# Patient Record
Sex: Female | Born: 1950 | Race: White | Hispanic: No | Marital: Married | Smoking: Former smoker
Health system: Southern US, Community
[De-identification: ages and names within clinical notes are randomized; demographics above are authoritative.]

## PROBLEM LIST (undated history)

## (undated) DIAGNOSIS — I10 Essential (primary) hypertension: Secondary | ICD-10-CM

## (undated) DIAGNOSIS — H269 Unspecified cataract: Secondary | ICD-10-CM

## (undated) DIAGNOSIS — E119 Type 2 diabetes mellitus without complications: Secondary | ICD-10-CM

## (undated) HISTORY — DX: Essential (primary) hypertension: I10

## (undated) HISTORY — PX: EYE SURGERY: SHX253

## (undated) HISTORY — DX: Type 2 diabetes mellitus without complications: E11.9

## (undated) HISTORY — DX: Unspecified cataract: H26.9

---

## 2018-08-08 LAB — HM HEPATITIS C SCREENING LAB: HM Hepatitis Screen: NEGATIVE

## 2018-10-11 DIAGNOSIS — R4189 Other symptoms and signs involving cognitive functions and awareness: Secondary | ICD-10-CM | POA: Insufficient documentation

## 2020-07-28 LAB — HM DIABETES EYE EXAM

## 2020-10-28 ENCOUNTER — Ambulatory Visit (INDEPENDENT_AMBULATORY_CARE_PROVIDER_SITE_OTHER): Payer: Medicare Other | Admitting: Family Medicine

## 2020-10-28 ENCOUNTER — Encounter: Payer: Self-pay | Admitting: Family Medicine

## 2020-10-28 ENCOUNTER — Other Ambulatory Visit: Payer: Self-pay

## 2020-10-28 VITALS — BP 106/68 | HR 63 | Temp 97.0°F | Ht 61.0 in | Wt 125.4 lb

## 2020-10-28 DIAGNOSIS — R4701 Aphasia: Secondary | ICD-10-CM | POA: Diagnosis not present

## 2020-10-28 DIAGNOSIS — I10 Essential (primary) hypertension: Secondary | ICD-10-CM

## 2020-10-28 DIAGNOSIS — E119 Type 2 diabetes mellitus without complications: Secondary | ICD-10-CM | POA: Diagnosis not present

## 2020-10-28 DIAGNOSIS — E1165 Type 2 diabetes mellitus with hyperglycemia: Secondary | ICD-10-CM | POA: Insufficient documentation

## 2020-10-28 DIAGNOSIS — I152 Hypertension secondary to endocrine disorders: Secondary | ICD-10-CM | POA: Insufficient documentation

## 2020-10-28 LAB — BAYER DCA HB A1C WAIVED: HB A1C (BAYER DCA - WAIVED): 6.3 % (ref <7.0)

## 2020-10-28 NOTE — Progress Notes (Signed)
New Patient Office Visit  Assessment & Plan:  1. Controlled type 2 diabetes mellitus without complication, without long-term current use of insulin (HCC) Lab Results  Component Value Date   HGBA1C 6.3 10/28/2020    - Diabetes is at goal of A1c < 7. - Medications: continue current medications - Home glucose monitoring: Continue monitoring - Patient is currently taking a statin. Patient is taking an ACE-inhibitor/ARB.   Diabetes Health Maintenance Due  Topic Date Due  . FOOT EXAM  Never done  . OPHTHALMOLOGY EXAM  Never done  . HEMOGLOBIN A1C  04/27/2021   - CBC with Differential/Platelet - CMP14+EGFR - Lipid panel - Microalbumin / creatinine urine ratio - Bayer DCA Hb A1c Waived  2. Essential hypertension - Well controlled on current regimen.  - CBC with Differential/Platelet - CMP14+EGFR - Lipid panel  3. Nonverbal - Patient's husband takes excellent care of her and communicates for her.   Follow-up: Return as directed after labs result.   Hendricks Limes, MSN, APRN, FNP-C [facility]  Subjective:  Patient ID: Barbara Schroeder, female    DOB: 22-Sep-1950  Age: 70 y.o. MRN: [id number]  Patient Care Team: Loman Brooklyn, FNP as PCP - General (Family Medicine)  CC:  Chief Complaint  Patient presents with  . New Patient (Initial Visit)    [facility]   . Establish Care    HPI Oluwatosin Bracy presents to establish care. She is transferring care from [facility].  Patient is accompanied by her husband who is her caregiver and provides all information.  Diabetes: Patient presents for follow up of diabetes. Current symptoms include: none. Known diabetic complications: none. Medication compliance: yes. Current diet: in general, a "healthy" diet  . Current exercise: none. Home blood sugar records: 100s. Is she  on ACE inhibitor or angiotensin II receptor blocker? Yes. Is she on a statin? Yes.    Review of Systems  Constitutional:  Negative for chills, fever, malaise/fatigue and weight loss.  HENT: Negative for congestion, ear discharge, ear pain, nosebleeds, sinus pain, sore throat and tinnitus.   Eyes: Negative for blurred vision, double vision, pain, discharge and redness.  Respiratory: Negative for cough, shortness of breath and wheezing.   Cardiovascular: Negative for chest pain, palpitations and leg swelling.  Gastrointestinal: Negative for abdominal pain, constipation, diarrhea, heartburn, nausea and vomiting.  Genitourinary: Negative for dysuria, frequency and urgency.  Musculoskeletal: Negative for myalgias.  Skin: Negative for rash.  Neurological: Negative for dizziness, seizures, weakness and headaches.  Psychiatric/Behavioral: Negative for depression, substance abuse and suicidal ideas. The patient is not nervous/anxious.     Current Outpatient Medications:  .  atenolol (TENORMIN) 50 MG tablet, Take 50 mg by mouth daily., Disp: , Rfl:  .  atorvastatin (LIPITOR) 20 MG tablet, Take 20 mg by mouth daily., Disp: , Rfl:  .  glipiZIDE (GLUCOTROL) 10 MG tablet, Take 10 mg by mouth 2 (two) times daily., Disp: , Rfl:  .  JARDIANCE 10 MG TABS tablet, Take 10 mg by mouth daily., Disp: , Rfl:  .  lisinopril-hydrochlorothiazide (ZESTORETIC) 20-12.5 MG tablet, Take 1 tablet by mouth daily., Disp: , Rfl:  .  metFORMIN (GLUCOPHAGE-XR) 500 MG 24 hr tablet, Take 1,000 mg by mouth 2 (two) times daily., Disp: , Rfl:   No Known Allergies  Past Medical History:  Diagnosis Date  . Cataract   . Diabetes mellitus without complication ([place])   . Hypertension     Past Surgical History:  Procedure Laterality Date  .  EYE SURGERY      Family History  Problem Relation Age of Onset  . Diabetes Mother   . Throat cancer Brother     Social History   Socioeconomic History  . Marital status: Married    Spouse name: Not on file  . Number of children: Not on file  . Years of education: Not on file  . Highest education  level: Not on file  Occupational History  . Not on file  Tobacco Use  . Smoking status: Former Smoker    Types: Cigarettes    Quit date: 1995    Years since quitting: 27.1  . Smokeless tobacco: Never Used  Vaping Use  . Vaping Use: Never used  Substance and Sexual Activity  . Alcohol use: Not Currently  . Drug use: Never  . Sexual activity: Not on file  Other Topics Concern  . Not on file  Social History Narrative  . Not on file   Social Determinants of Health   Financial Resource Strain: Not on file  Food Insecurity: Not on file  Transportation Needs: Not on file  Physical Activity: Not on file  Stress: Not on file  Social Connections: Not on file  Intimate Partner Violence: Not on file    Objective:   Today's Vitals: BP 106/68   Pulse 63   Temp (!) 97 F (36.1 C) (Temporal)   Ht 5' 1"  (1.549 m)   Wt 125 lb 6.4 oz (56.9 kg)   SpO2 99%   BMI 23.69 kg/m   Physical Exam Vitals reviewed.  Constitutional:      General: She is not in acute distress.    Appearance: Normal appearance. She is normal weight. She is not ill-appearing, toxic-appearing or diaphoretic.  HENT:     Head: Normocephalic and atraumatic.  Eyes:     General: No scleral icterus.       Right eye: No discharge.        Left eye: No discharge.     Conjunctiva/sclera: Conjunctivae normal.  Cardiovascular:     Rate and Rhythm: Normal rate and regular rhythm.     Heart sounds: Normal heart sounds. No murmur heard. No friction rub. No gallop.   Pulmonary:     Effort: Pulmonary effort is normal. No respiratory distress.     Breath sounds: Normal breath sounds. No stridor. No wheezing, rhonchi or rales.  Musculoskeletal:        General: Normal range of motion.     Cervical back: Normal range of motion.  Skin:    General: Skin is warm and dry.     Capillary Refill: Capillary refill takes less than 2 seconds.  Neurological:     General: No focal deficit present.     Mental Status: She is alert  and oriented to person, place, and time. Mental status is at baseline.  Psychiatric:        Attention and Perception: Attention normal.        Mood and Affect: Mood normal.        Speech: She is noncommunicative.        Behavior: Behavior normal.

## 2020-10-29 ENCOUNTER — Encounter: Payer: Self-pay | Admitting: Family Medicine

## 2020-10-29 LAB — MICROALBUMIN / CREATININE URINE RATIO
Creatinine, Urine: 21.6 mg/dL
Microalb/Creat Ratio: 20 mg/g creat (ref 0–29)
Microalbumin, Urine: 4.3 ug/mL

## 2020-10-29 LAB — CBC WITH DIFFERENTIAL/PLATELET
Basophils Absolute: 0 10*3/uL (ref 0.0–0.2)
Basos: 1 %
EOS (ABSOLUTE): 0.6 10*3/uL — ABNORMAL HIGH (ref 0.0–0.4)
Eos: 8 %
Hematocrit: 39.3 % (ref 34.0–46.6)
Hemoglobin: 13.1 g/dL (ref 11.1–15.9)
Immature Grans (Abs): 0 10*3/uL (ref 0.0–0.1)
Immature Granulocytes: 0 %
Lymphocytes Absolute: 1.5 10*3/uL (ref 0.7–3.1)
Lymphs: 21 %
MCH: 29.3 pg (ref 26.6–33.0)
MCHC: 33.3 g/dL (ref 31.5–35.7)
MCV: 88 fL (ref 79–97)
Monocytes Absolute: 0.5 10*3/uL (ref 0.1–0.9)
Monocytes: 7 %
Neutrophils Absolute: 4.4 10*3/uL (ref 1.4–7.0)
Neutrophils: 63 %
Platelets: 195 10*3/uL (ref 150–450)
RBC: 4.47 x10E6/uL (ref 3.77–5.28)
RDW: 11.9 % (ref 11.7–15.4)
WBC: 7.1 10*3/uL (ref 3.4–10.8)

## 2020-10-29 LAB — CMP14+EGFR
ALT: 32 IU/L (ref 0–32)
AST: 25 IU/L (ref 0–40)
Albumin/Globulin Ratio: 2.2 (ref 1.2–2.2)
Albumin: 4.4 g/dL (ref 3.8–4.8)
Alkaline Phosphatase: 61 IU/L (ref 44–121)
BUN/Creatinine Ratio: 21 (ref 12–28)
BUN: 15 mg/dL (ref 8–27)
Bilirubin Total: 0.4 mg/dL (ref 0.0–1.2)
CO2: 21 mmol/L (ref 20–29)
Calcium: 9.8 mg/dL (ref 8.7–10.3)
Chloride: 105 mmol/L (ref 96–106)
Creatinine, Ser: 0.71 mg/dL (ref 0.57–1.00)
GFR calc Af Amer: 102 mL/min/{1.73_m2} (ref >59)
GFR calc non Af Amer: 88 mL/min/{1.73_m2} (ref >59)
Globulin, Total: 2 g/dL (ref 1.5–4.5)
Glucose: 170 mg/dL — ABNORMAL HIGH (ref 65–99)
Potassium: 4.3 mmol/L (ref 3.5–5.2)
Sodium: 140 mmol/L (ref 134–144)
Total Protein: 6.4 g/dL (ref 6.0–8.5)

## 2020-10-29 LAB — LIPID PANEL
Chol/HDL Ratio: 2.4 ratio (ref 0.0–4.4)
Cholesterol, Total: 99 mg/dL — ABNORMAL LOW (ref 100–199)
HDL: 41 mg/dL (ref >39)
LDL Chol Calc (NIH): 25 mg/dL (ref 0–99)
Triglycerides: 214 mg/dL — ABNORMAL HIGH (ref 0–149)
VLDL Cholesterol Cal: 33 mg/dL (ref 5–40)

## 2020-10-30 ENCOUNTER — Telehealth: Payer: Self-pay

## 2020-11-03 ENCOUNTER — Encounter: Payer: Self-pay | Admitting: Family Medicine

## 2020-12-07 ENCOUNTER — Other Ambulatory Visit: Payer: Self-pay | Admitting: Family Medicine

## 2020-12-07 NOTE — Telephone Encounter (Signed)
  Prescription Request  12/07/2020  What is the name of the medication or equipment? Lisinopril  Have you contacted your pharmacy to request a refill? (if applicable) no  Which pharmacy would you like this sent to? [facility] in [place]  Patient notified that their request is being sent to the clinical staff for review and that they should receive a response within 2 business days.

## 2020-12-09 MED ORDER — LISINOPRIL-HYDROCHLOROTHIAZIDE 20-12.5 MG PO TABS: 1.0000 | ORAL_TABLET | Freq: Every day | ORAL | 1 refills | Status: DC

## 2020-12-09 NOTE — Telephone Encounter (Signed)
Please advise patient to schedule f/u 6 months from the last.

## 2021-01-06 ENCOUNTER — Telehealth: Payer: Self-pay

## 2021-01-06 DIAGNOSIS — I10 Essential (primary) hypertension: Secondary | ICD-10-CM

## 2021-01-06 NOTE — Telephone Encounter (Signed)
  Prescription Request  01/06/2021  What is the name of the medication or equipment? Atenolol  Have you contacted your pharmacy to request a refill? (if applicable) Yes  Which pharmacy would you like this sent to? [facility], [place] [employer]  Please call pts husband when its sent [phone]

## 2021-01-06 NOTE — Telephone Encounter (Signed)
Medication has not been sent by Alona Bene  Please advise if this medication can be sent in.

## 2021-01-07 MED ORDER — ATENOLOL 50 MG PO TABS: 50.0000 mg | ORAL_TABLET | Freq: Every day | ORAL | 1 refills | Status: DC

## 2021-01-07 NOTE — Telephone Encounter (Signed)
Refilled

## 2021-01-18 ENCOUNTER — Other Ambulatory Visit: Payer: Self-pay | Admitting: Family Medicine

## 2021-01-18 DIAGNOSIS — E119 Type 2 diabetes mellitus without complications: Secondary | ICD-10-CM

## 2021-01-18 MED ORDER — ATORVASTATIN CALCIUM 20 MG PO TABS: 20.0000 mg | ORAL_TABLET | Freq: Every day | ORAL | 1 refills | Status: DC

## 2021-01-18 NOTE — Progress Notes (Signed)
Patient in need of a refill per husband who was here for a visit for himself.

## 2021-02-25 ENCOUNTER — Telehealth: Payer: Self-pay | Admitting: Family Medicine

## 2021-02-25 MED ORDER — METFORMIN HCL ER 500 MG PO TB24: 1000.0000 mg | ORAL_TABLET | Freq: Two times a day (BID) | ORAL | 1 refills | Status: DC

## 2021-02-25 NOTE — Telephone Encounter (Signed)
Left detailed message med sent in

## 2021-02-25 NOTE — Telephone Encounter (Signed)
  Prescription Request  02/25/2021  What is the name of the medication or equipment?metFORMIN (GLUCOPHAGE-XR) 500 MG 24 hr tablet   Have you contacted your pharmacy to request a refill? (if applicable) yes  Which pharmacy would you like this sent to? [facility] in [place]    Patient notified that their request is being sent to the clinical staff for review and that they should receive a response within 2 business days.

## 2021-04-02 ENCOUNTER — Other Ambulatory Visit: Payer: Self-pay | Admitting: Family Medicine

## 2021-04-02 MED ORDER — GLIPIZIDE 10 MG PO TABS: 10.0000 mg | ORAL_TABLET | Freq: Two times a day (BID) | ORAL | 1 refills | Status: DC

## 2021-04-02 NOTE — Telephone Encounter (Signed)
  Prescription Request  04/02/2021  What is the name of the medication or equipment? Glipizide 10 mg. Britney hadn't filled before and needs a RX called in  Have you contacted your pharmacy to request a refill? (if applicable) YES  Which pharmacy would you like this sent to? [facility] in [place], [place]   Patient notified that their request is being sent to the clinical staff for review and that they should receive a response within 2 business days.

## 2021-04-02 NOTE — Telephone Encounter (Signed)
LMOVM refill sent to pharmacy 

## 2021-04-27 ENCOUNTER — Other Ambulatory Visit: Payer: Self-pay

## 2021-04-27 ENCOUNTER — Ambulatory Visit (INDEPENDENT_AMBULATORY_CARE_PROVIDER_SITE_OTHER): Payer: Medicare Other | Admitting: Family Medicine

## 2021-04-27 ENCOUNTER — Ambulatory Visit (INDEPENDENT_AMBULATORY_CARE_PROVIDER_SITE_OTHER): Payer: Medicare Other

## 2021-04-27 ENCOUNTER — Encounter: Payer: Self-pay | Admitting: Family Medicine

## 2021-04-27 VITALS — BP 115/71 | HR 71 | Temp 96.7°F | Ht 61.0 in | Wt 132.0 lb

## 2021-04-27 DIAGNOSIS — Z780 Asymptomatic menopausal state: Secondary | ICD-10-CM

## 2021-04-27 DIAGNOSIS — I10 Essential (primary) hypertension: Secondary | ICD-10-CM | POA: Diagnosis not present

## 2021-04-27 DIAGNOSIS — E1165 Type 2 diabetes mellitus with hyperglycemia: Secondary | ICD-10-CM

## 2021-04-27 LAB — BAYER DCA HB A1C WAIVED: HB A1C (BAYER DCA - WAIVED): 7.5 % — ABNORMAL HIGH (ref <7.0)

## 2021-04-27 MED ORDER — EMPAGLIFLOZIN 25 MG PO TABS: 25.0000 mg | ORAL_TABLET | Freq: Every day | ORAL | 2 refills | Status: DC

## 2021-04-27 NOTE — Patient Instructions (Addendum)
Reminder: please return after mid-September for your flu shot. If you receive this elsewhere, such as your pharmacy, please let us know so we can get this documented in your chart.   

## 2021-04-27 NOTE — Progress Notes (Signed)
Assessment & Plan:  1. Type 2 diabetes mellitus with hyperglycemia, without long-term current use of insulin (HCC) Lab Results  Component Value Date   HGBA1C 6.3 10/28/2020  A1c 7.5 today; increased from 6.3 six months ago. - Diabetes is not at goal of A1c < 7. - Medications:  Increase Jardiance from 10 mg to 25 mg daily - Home glucose monitoring: Continue monitoring - Patient is currently taking a statin. Patient is taking an ACE-inhibitor/ARB.   Diabetes Health Maintenance Due  Topic Date Due   HEMOGLOBIN A1C  04/27/2021   OPHTHALMOLOGY EXAM  07/28/2021   FOOT EXAM  04/27/2022   - CBC with Differential/Platelet - CMP14+EGFR - Lipid panel - Microalbumin / creatinine urine ratio - Bayer DCA Hb A1c Waived - Referred to clinical pharmacist for prescription assistance for Jardiance   2. Essential hypertension - Well controlled on current regimen.  - CBC with Differential/Platelet - CMP14+EGFR - Lipid panel  3. Postmenopausal estrogen deficiency DEXA today   Follow-up: Return in about 3 months (around 07/28/2021) for DM.   Hendricks Limes, MSN, APRN, FNP-C [facility]  Subjective:  Patient ID: Barbara Schroeder, female    DOB: [birth year]  Age: 70 y.o. MRN: [id number]  Patient Care Team: Loman Brooklyn, FNP as PCP - General (Family Medicine)  CC:  Chief Complaint  Patient presents with   Diabetes   Hypertension    6 month follow up of chronic medical conditions     HPI Barbara Schroeder presents for a follow-up of chronic medical conditions.  Patient is accompanied by her husband who is her caregiver and provides all information as patient is nonverbal.  Diabetes: Patient presents for follow up of diabetes. Current symptoms include: hyperglycemia. Known diabetic complications: none. Medication compliance: yes. Current diet:  no sweets; high carbs . Husband thinks she is getting up and eating at night and washing the dishes so he does not  know. Current exercise: none. Home blood sugar records:  170s fasting; 200-300 after breakfast for the past month . Is she  on ACE inhibitor or angiotensin II receptor blocker? Yes. Is she on a statin? Yes.    Review of Systems  Constitutional:  Negative for chills, fever, malaise/fatigue and weight loss.  HENT:  Negative for congestion, ear discharge, ear pain, nosebleeds, sinus pain, sore throat and tinnitus.   Eyes:  Negative for blurred vision, double vision, pain, discharge and redness.  Respiratory:  Negative for cough, shortness of breath and wheezing.   Cardiovascular:  Negative for chest pain, palpitations and leg swelling.  Gastrointestinal:  Negative for abdominal pain, constipation, diarrhea, heartburn, nausea and vomiting.  Genitourinary:  Negative for dysuria, frequency and urgency.  Musculoskeletal:  Negative for myalgias.  Skin:  Negative for rash.  Neurological:  Negative for dizziness, seizures, weakness and headaches.  Psychiatric/Behavioral:  Negative for depression, substance abuse and suicidal ideas. The patient is not nervous/anxious.    Current Outpatient Medications:    atenolol (TENORMIN) 50 MG tablet, Take 1 tablet (50 mg total) by mouth daily., Disp: 90 tablet, Rfl: 1   atorvastatin (LIPITOR) 20 MG tablet, Take 1 tablet (20 mg total) by mouth daily., Disp: 90 tablet, Rfl: 1   glipiZIDE (GLUCOTROL) 10 MG tablet, Take 1 tablet (10 mg total) by mouth 2 (two) times daily., Disp: 180 tablet, Rfl: 1   JARDIANCE 10 MG TABS tablet, Take 10 mg by mouth daily., Disp: , Rfl:    lisinopril-hydrochlorothiazide (ZESTORETIC) 20-12.5 MG tablet, Take 1  tablet by mouth daily., Disp: 90 tablet, Rfl: 1   metFORMIN (GLUCOPHAGE-XR) 500 MG 24 hr tablet, Take 2 tablets (1,000 mg total) by mouth 2 (two) times daily., Disp: 180 tablet, Rfl: 1  No Known Allergies  Past Medical History:  Diagnosis Date   Cataract    Diabetes mellitus without complication ([place])    Hypertension      Past Surgical History:  Procedure Laterality Date   EYE SURGERY      Family History  Problem Relation Age of Onset   Diabetes Mother    Throat cancer Brother     Social History   Socioeconomic History   Marital status: Married    Spouse name: Not on file   Number of children: Not on file   Years of education: Not on file   Highest education level: Not on file  Occupational History   Not on file  Tobacco Use   Smoking status: Former    Types: Cigarettes    Quit date: 1995    Years since quitting: 27.6   Smokeless tobacco: Never  Vaping Use   Vaping Use: Never used  Substance and Sexual Activity   Alcohol use: Not Currently   Drug use: Never   Sexual activity: Not on file  Other Topics Concern   Not on file  Social History Narrative   Not on file   Social Determinants of Health   Financial Resource Strain: Not on file  Food Insecurity: Not on file  Transportation Needs: Not on file  Physical Activity: Not on file  Stress: Not on file  Social Connections: Not on file  Intimate Partner Violence: Not on file    Objective:   Today's Vitals: BP 115/71   Pulse 71   Temp (!) 96.7 F (35.9 C) (Temporal)   Ht 5' 1"  (1.549 m)   Wt 132 lb (59.9 kg)   SpO2 98%   BMI 24.94 kg/m   Physical Exam Vitals reviewed.  Constitutional:      General: She is not in acute distress.    Appearance: Normal appearance. She is normal weight. She is not ill-appearing, toxic-appearing or diaphoretic.  HENT:     Head: Normocephalic and atraumatic.  Eyes:     General: No scleral icterus.       Right eye: No discharge.        Left eye: No discharge.     Conjunctiva/sclera: Conjunctivae normal.  Cardiovascular:     Rate and Rhythm: Normal rate and regular rhythm.     Heart sounds: Normal heart sounds. No murmur heard.   No friction rub. No gallop.  Pulmonary:     Effort: Pulmonary effort is normal. No respiratory distress.     Breath sounds: Normal breath sounds. No  stridor. No wheezing, rhonchi or rales.  Musculoskeletal:        General: Normal range of motion.     Cervical back: Normal range of motion.  Skin:    General: Skin is warm and dry.     Capillary Refill: Capillary refill takes less than 2 seconds.  Neurological:     General: No focal deficit present.     Mental Status: She is alert and oriented to person, place, and time. Mental status is at baseline.  Psychiatric:        Attention and Perception: Attention normal.        Mood and Affect: Mood normal.        Speech: She is noncommunicative.  Behavior: Behavior normal.

## 2021-04-28 ENCOUNTER — Telehealth: Payer: Self-pay

## 2021-04-28 ENCOUNTER — Telehealth: Payer: Self-pay | Admitting: Family Medicine

## 2021-04-28 ENCOUNTER — Encounter: Payer: Self-pay | Admitting: Family Medicine

## 2021-04-28 DIAGNOSIS — M8580 Other specified disorders of bone density and structure, unspecified site: Secondary | ICD-10-CM

## 2021-04-28 HISTORY — DX: Other specified disorders of bone density and structure, unspecified site: M85.80

## 2021-04-28 LAB — CBC WITH DIFFERENTIAL/PLATELET
Basophils Absolute: 0.1 10*3/uL (ref 0.0–0.2)
Basos: 1 %
EOS (ABSOLUTE): 0.5 10*3/uL — ABNORMAL HIGH (ref 0.0–0.4)
Eos: 9 %
Hematocrit: 40.3 % (ref 34.0–46.6)
Hemoglobin: 13.5 g/dL (ref 11.1–15.9)
Immature Grans (Abs): 0 10*3/uL (ref 0.0–0.1)
Immature Granulocytes: 0 %
Lymphocytes Absolute: 1.3 10*3/uL (ref 0.7–3.1)
Lymphs: 24 %
MCH: 29.2 pg (ref 26.6–33.0)
MCHC: 33.5 g/dL (ref 31.5–35.7)
MCV: 87 fL (ref 79–97)
Monocytes Absolute: 0.4 10*3/uL (ref 0.1–0.9)
Monocytes: 7 %
Neutrophils Absolute: 3 10*3/uL (ref 1.4–7.0)
Neutrophils: 59 %
Platelets: 159 10*3/uL (ref 150–450)
RBC: 4.63 x10E6/uL (ref 3.77–5.28)
RDW: 12.7 % (ref 11.7–15.4)
WBC: 5.2 10*3/uL (ref 3.4–10.8)

## 2021-04-28 LAB — CMP14+EGFR
ALT: 31 IU/L (ref 0–32)
AST: 20 IU/L (ref 0–40)
Albumin/Globulin Ratio: 1.9 (ref 1.2–2.2)
Albumin: 4.1 g/dL (ref 3.8–4.8)
Alkaline Phosphatase: 53 IU/L (ref 44–121)
BUN/Creatinine Ratio: 14 (ref 12–28)
BUN: 11 mg/dL (ref 8–27)
Bilirubin Total: 0.6 mg/dL (ref 0.0–1.2)
CO2: 21 mmol/L (ref 20–29)
Calcium: 9.4 mg/dL (ref 8.7–10.3)
Chloride: 106 mmol/L (ref 96–106)
Creatinine, Ser: 0.81 mg/dL (ref 0.57–1.00)
Globulin, Total: 2.2 g/dL (ref 1.5–4.5)
Glucose: 329 mg/dL — ABNORMAL HIGH (ref 65–99)
Potassium: 4.4 mmol/L (ref 3.5–5.2)
Sodium: 142 mmol/L (ref 134–144)
Total Protein: 6.3 g/dL (ref 6.0–8.5)
eGFR: 80 mL/min/{1.73_m2} (ref >59)

## 2021-04-28 LAB — LIPID PANEL
Chol/HDL Ratio: 2.6 ratio (ref 0.0–4.4)
Cholesterol, Total: 84 mg/dL — ABNORMAL LOW (ref 100–199)
HDL: 32 mg/dL — ABNORMAL LOW (ref >39)
LDL Chol Calc (NIH): 21 mg/dL (ref 0–99)
Triglycerides: 198 mg/dL — ABNORMAL HIGH (ref 0–149)
VLDL Cholesterol Cal: 31 mg/dL (ref 5–40)

## 2021-04-28 NOTE — Chronic Care Management (AMB) (Signed)
  Chronic Care Management   Note  04/28/2021 Name: Barbara Schroeder MRN: [id number] DOB: [birth year]  Barbara Schroeder is a 70 y.o. year old female who is a primary care patient of Loman Brooklyn, FNP. I reached out to [employer] by phone today in response to a referral sent by [name] PCP, Loman Brooklyn, FNP      [name] was given information about Chronic Care Management services today including:  CCM service includes personalized support from designated clinical staff supervised by her physician, including individualized plan of care and coordination with other care providers 24/7 contact phone numbers for assistance for urgent and routine care needs. Service will only be billed when office clinical staff spend 20 minutes or more in a month to coordinate care. Only one practitioner may furnish and bill the service in a calendar month. The patient may stop CCM services at any time (effective at the end of the month) by phone call to the office staff. The patient will be responsible for cost sharing (co-pay) of up to 20% of the service fee (after annual deductible is met).  Patient agreed to services and verbal consent obtained.   Follow up plan: Telephone appointment with care management team member scheduled for:05/14/2021  Noreene Larsson, [address], [place], [place] [address] Direct Dial: [phone] [email or web address]

## 2021-04-28 NOTE — Telephone Encounter (Signed)
Refer to lab results.  

## 2021-05-14 ENCOUNTER — Ambulatory Visit (INDEPENDENT_AMBULATORY_CARE_PROVIDER_SITE_OTHER): Payer: Medicare Other | Admitting: Pharmacist

## 2021-05-14 DIAGNOSIS — E1165 Type 2 diabetes mellitus with hyperglycemia: Secondary | ICD-10-CM | POA: Diagnosis not present

## 2021-05-14 MED ORDER — DAPAGLIFLOZIN PROPANEDIOL 10 MG PO TABS: 10.0000 mg | ORAL_TABLET | Freq: Every day | ORAL | 3 refills | Status: DC

## 2021-05-14 NOTE — Progress Notes (Signed)
Chronic Care Management Pharmacy Note  05/14/2021 Name:  Barbara Schroeder MRN:  [id number] DOB:  10/31/1950  Summary: diabetes management   Recommendations/Changes made from today's visit: Diabetes: Uncontrolled-recent increase in a1c TO 7.4%; current treatment:metformin, glipizide, jardiance (samples);  Patient cannot afford jardiance (tolerated samples well) Will transition to farxiga 10MG DAILY for ease of patient assistance (application sent to [place] patient assistance) Current glucose readings: fasting glucose: <160-170, post prandial glucose: <200 Denies hypoglycemic/hyperglycemic symptoms Discussed meal planning options and Plate method for healthy eating Avoid sugary drinks and desserts Incorporate balanced protein, non starchy veggies, 1 serving of carbohydrate with each meal Increase water intake Increase physical activity as able  Current exercise: N/A Educated on [place] patient finances. APPLICATION SUBMITTED FOR FARXIGA ([place])--30 DAY SAMPLES GIVEN TO PATIENT TODAY  Follow Up Plan: Telephone follow up appointment with care management team member scheduled for: 1 MONTH  Subjective: Barbara Schroeder is an 70 y.o. year old female who is a primary patient of Loman Brooklyn, FNP.  The CCM team was consulted for assistance with disease management and care coordination needs.    Engaged with patient by telephone for initial visit in response to provider referral for pharmacy case management and/or care coordination services.   Consent to Services:  The patient was given the following information about Chronic Care Management services today, agreed to services, and gave verbal consent: 1. CCM service includes personalized support from designated clinical staff supervised by the primary care provider, including individualized plan of care and coordination with other care providers 2. 24/7 contact phone numbers for assistance for urgent and  routine care needs. 3. Service will only be billed when office clinical staff spend 20 minutes or more in a month to coordinate care. 4. Only one practitioner may furnish and bill the service in a calendar month. 5.The patient may stop CCM services at any time (effective at the end of the month) by phone call to the office staff. 6. The patient will be responsible for cost sharing (co-pay) of up to 20% of the service fee (after annual deductible is met). Patient agreed to services and consent obtained.  Patient Care Team: Loman Brooklyn, FNP as PCP - General (Family Medicine) Lavera Guise, [facility] as Pharmacist (Family Medicine)   Objective:  Lab Results  Component Value Date   CREATININE 0.81 04/27/2021   CREATININE 0.71 10/28/2020    Lab Results  Component Value Date   HGBA1C 7.5 (H) 04/27/2021   Last diabetic Eye exam:  Lab Results  Component Value Date/Time   HMDIABEYEEXA Retinopathy (A) 07/28/2020 12:00 AM    Last diabetic Foot exam: No results found for: HMDIABFOOTEX      Component Value Date/Time   CHOL 84 (L) 04/27/2021 1339   TRIG 198 (H) 04/27/2021 1339   HDL 32 (L) 04/27/2021 1339   CHOLHDL 2.6 04/27/2021 1339   LDLCALC 21 04/27/2021 1339    Hepatic Function Latest Ref Rng & Units 04/27/2021 10/28/2020  Total Protein 6.0 - 8.5 g/dL 6.3 6.4  Albumin 3.8 - 4.8 g/dL 4.1 4.4  AST 0 - 40 IU/L 20 25  ALT 0 - 32 IU/L 31 32  Alk Phosphatase 44 - 121 IU/L 53 61  Total Bilirubin 0.0 - 1.2 mg/dL 0.6 0.4    No results found for: TSH, FREET4  CBC Latest Ref Rng & Units 04/27/2021 10/28/2020  WBC 3.4 - 10.8 x10E3/uL 5.2 7.1  Hemoglobin 11.1 - 15.9 g/dL 13.5 13.1  Hematocrit 34.0 - 46.6 % 40.3 39.3  Platelets 150 - 450 x10E3/uL 159 195    No results found for: VD25OH  Clinical ASCVD: No  The ASCVD Risk score Mikey Bussing DC Jr., et al., 2013) failed to calculate for the following reasons:   The valid total cholesterol range is 130 to 320 mg/dL    Other: (CHADS2VASc if Afib,  PHQ9 if depression, MMRC or CAT for COPD, ACT, DEXA)  Social History   Tobacco Use  Smoking Status Former   Types: Cigarettes   Quit date: 1995   Years since quitting: 27.6  Smokeless Tobacco Never   BP Readings from Last 3 Encounters:  04/27/21 115/71  10/28/20 106/68   Pulse Readings from Last 3 Encounters:  04/27/21 71  10/28/20 63   Wt Readings from Last 3 Encounters:  04/27/21 132 lb (59.9 kg)  10/28/20 125 lb 6.4 oz (56.9 kg)    Assessment: Review of patient past medical history, allergies, medications, health status, including review of consultants reports, laboratory and other test data, was performed as part of comprehensive evaluation and provision of chronic care management services.   SDOH:  (Social Determinants of Health) assessments and interventions performed:    CCM Care Plan  No Known Allergies  Medications Reviewed Today     Reviewed by Lavera Guise, [facility] (Pharmacist) on 05/14/21 at 80  Med List Status: <None>   Medication Order Taking? Sig Documenting Provider Last Dose Status Informant  atenolol (TENORMIN) 50 MG tablet [id number] No Take 1 tablet (50 mg total) by mouth daily. Loman Brooklyn, FNP Taking Active   atorvastatin (LIPITOR) 20 MG tablet [id number] No Take 1 tablet (20 mg total) by mouth daily. Loman Brooklyn, FNP Taking Active   dapagliflozin propanediol (FARXIGA) 10 MG TABS tablet [id number] Yes Take 1 tablet (10 mg total) by mouth daily before breakfast. Loman Brooklyn, FNP  Active   glipiZIDE (GLUCOTROL) 10 MG tablet [id number] No Take 1 tablet (10 mg total) by mouth 2 (two) times daily. Loman Brooklyn, FNP Taking Active   lisinopril-hydrochlorothiazide (ZESTORETIC) 20-12.5 MG tablet [id number] No Take 1 tablet by mouth daily. Loman Brooklyn, FNP Taking Active   metFORMIN (GLUCOPHAGE-XR) 500 MG 24 hr tablet [id number] No Take 2 tablets (1,000 mg total) by mouth 2 (two) times daily. Loman Brooklyn, FNP Taking Active              Patient Active Problem List   Diagnosis Date Noted   Osteopenia 04/28/2021   Controlled type 2 diabetes mellitus without complication, without long-term current use of insulin ([place]) 10/28/2020   Essential hypertension 10/28/2020   Nonverbal 10/28/2020    Immunization History  Administered Date(s) Administered   Influenza, Seasonal, Injecte, Preservative Fre 08/01/2016   Influenza,inj,Quad PF,6+ Mos 08/08/2018, 05/21/2019   Influenza-Unspecified 07/30/2020   PFIZER(Purple Top)SARS-COV-2 Vaccination 10/25/2019, 11/15/2019, 07/22/2020   Pneumococcal Conjugate-13 02/06/2019   Pneumococcal Polysaccharide-23 01/09/2018   Tdap 12/02/2019    Conditions to be addressed/monitored: DMII  Care Plan : PHARMD MEDICATION MANAGEMENT  Updates made by Lavera Guise, [place] since 05/14/2021 12:00 AM     Problem: DISEASE PROGRESSION PREVENTION      Long-Range Goal: T2DM   This Visit's Progress: Not on track  Priority: High  Note:   Current Barriers:  Unable to independently afford treatment regimen Unable to maintain control of T2DM Suboptimal therapeutic regimen for T2DM Patient is non-verbal; husband communicates on her behalf  Pharmacist Clinical Goal(s):  Over the next 90  days, patient will verbalize ability to afford treatment regimen maintain control of t2dm as evidenced by improved glycemic control  through collaboration with PharmD and provider.    Interventions: 1:1 collaboration with Loman Brooklyn, FNP regarding development and update of comprehensive plan of care as evidenced by provider attestation and co-signature Inter-disciplinary care team collaboration (see longitudinal plan of care) Comprehensive medication review performed; medication list updated in electronic medical record  Diabetes: Uncontrolled-recent increase in a1c TO 7.4%; current treatment:metformin, glipizide, jardiance (samples);  Patient cannot afford jardiance (tolerated samples well) Will  transition to farxiga 10MG DAILY for ease of patient assistance (application sent to [place]&me patient assistance) Current glucose readings: fasting glucose: <160-170, post prandial glucose: <200 Denies hypoglycemic/hyperglycemic symptoms Discussed meal planning options and Plate method for healthy eating Avoid sugary drinks and desserts Incorporate balanced protein, non starchy veggies, 1 serving of carbohydrate with each meal Increase water intake Increase physical activity as able  Current exercise: N/A Educated on [place] patient finances. APPLICATION SUBMITTED FOR FARXIGA ([place])--30 DAY SAMPLES GIVEN TO PATIENT TODAY   Patient Goals/Self-Care Activities Over the next 90 days, patient will:  - take medications as prescribed check glucose DAILY (FASTING), document, and provide at future appointments collaborate with provider on medication access solutions  Follow Up Plan: Telephone follow up appointment with care management team member scheduled for: 1 MONTH      Medication Assistance: Application for farxiga ([place])  medication assistance program. in process.  Anticipated assistance start date tbd.  See plan of care for additional detail.  Patient's preferred pharmacy is:  [facility] [place], [place] [address] [place] [address] Phone: [phone] Fax: [phone]  [facility] [place], [place] [address] Phone: [phone] Fax: [phone]  Uses pill box? No - husband assists Pt /caregiver endorses 100% compliance  Follow Up:  Patient agrees to Care Plan and Follow-up.  Plan: Telephone follow up appointment with care management team member scheduled for:  1 month  Regina Eck, PharmD, BCPS Clinical Pharmacist, [place]  II Phone [phone]

## 2021-05-14 NOTE — Patient Instructions (Signed)
Visit Information  PATIENT GOALS:  Goals Addressed               This Visit's Progress     Patient Stated     T2DM PHARMD GOAL (pt-stated)        Current Barriers:  Unable to independently afford treatment regimen Unable to maintain control of T2DM Suboptimal therapeutic regimen for T2DM Patient is non-verbal; husband communicates on her behalf  Pharmacist Clinical Goal(s):  Over the next 90 days, patient will verbalize ability to afford treatment regimen maintain control of t2dm as evidenced by improved glycemic control  through collaboration with PharmD and provider.    Interventions: 1:1 collaboration with Gwenlyn Fudge, FNP regarding development and update of comprehensive plan of care as evidenced by provider attestation and co-signature Inter-disciplinary care team collaboration (see longitudinal plan of care) Comprehensive medication review performed; medication list updated in electronic medical record  Diabetes: Uncontrolled-recent increase in a1c TO 7.4%; current treatment:metformin, glipizide, jardiance (samples);  Patient cannot afford jardiance (tolerated samples well) Will transition to farxiga 10MG  DAILY for ease of patient assistance (application sent to [place] patient assistance) Current glucose readings: fasting glucose: <160-170, post prandial glucose: <200 Denies hypoglycemic/hyperglycemic symptoms Discussed meal planning options and Plate method for healthy eating Avoid sugary drinks and desserts Incorporate balanced protein, non starchy veggies, 1 serving of carbohydrate with each meal Increase water intake Increase physical activity as able  Current exercise: N/A Educated on TO FARXIGA TRANSITION Assessed patient finances. APPLICATION SUBMITTED FOR FARXIGA ([place])--30 DAY SAMPLES GIVEN TO PATIENT TODAY   Patient Goals/Self-Care Activities Over the next 90 days, patient will:  - take medications as prescribed check glucose DAILY  (FASTING), document, and provide at future appointments collaborate with provider on medication access solutions  Follow Up Plan: Telephone follow up appointment with care management team member scheduled for: 1 MONTH         The patient verbalized understanding of instructions, educational materials, and care plan provided today and declined offer to receive copy of patient instructions, educational materials, and care plan.   Telephone follow up appointment with care management team member scheduled for: 1 month  Signature [employer], PharmD, BCPS Clinical Pharmacist, [facility]  II Phone [phone]

## 2021-05-25 ENCOUNTER — Ambulatory Visit (INDEPENDENT_AMBULATORY_CARE_PROVIDER_SITE_OTHER): Payer: Medicare Other

## 2021-05-25 DIAGNOSIS — Z0000 Encounter for general adult medical examination without abnormal findings: Secondary | ICD-10-CM | POA: Diagnosis not present

## 2021-05-25 NOTE — Progress Notes (Signed)
MEDICARE ANNUAL WELLNESS VISIT  05/25/2021  Telephone Visit Disclaimer This Medicare AWV was conducted by telephone due to national recommendations for restrictions regarding the COVID-19 Pandemic (e.g. social distancing).  I verified, using two identifiers, that I am speaking with Barbara Schroeder or their authorized healthcare agent. I discussed the limitations, risks, security, and privacy concerns of performing an evaluation and management service by telephone and the potential availability of an in-person appointment in the future. The patient expressed understanding and agreed to proceed.  Location of Patient: Home Location of Provider (nurse):  [facility]  Subjective:    Barbara Schroeder is a 70 y.o. female patient of Gwenlyn Fudge, FNP who had a Medicare Annual Wellness Visit today via telephone. Barbara Schroeder is Disabled and lives with their spouse. she has one child. she reports that she is not socially active and does interact with friends/family regularly. she is not physically active and enjoys shopping.  Patient Care Team: Gwenlyn Fudge, FNP as PCP - General (Family Medicine) Danella Maiers, [facility] as Pharmacist (Family Medicine)  Advanced Directives 05/25/2021  Does Patient Have a Medical Advance Directive? No  Would patient like information on creating a medical advance directive? No - Guardian declined    Hospital Utilization Over the Past 12 Months: # of hospitalizations or ER visits: 0 # of surgeries: 0  Review of Systems    Patient reports that her overall health is unchanged compared to last year.  Patient Reported Readings (BP, Pulse, CBG, Weight, etc) CBG- 96  Pain Assessment Pain : No/denies pain     Current Medications & Allergies (verified) Allergies as of 05/25/2021   No Known Allergies      Medication List        Accurate as of May 25, 2021  4:20 PM. If you have any questions, ask your nurse or doctor.           atenolol 50 MG tablet Commonly known as: TENORMIN Take 1 tablet (50 mg total) by mouth daily.   atorvastatin 20 MG tablet Commonly known as: LIPITOR Take 1 tablet (20 mg total) by mouth daily.   dapagliflozin propanediol 10 MG Tabs tablet Commonly known as: Farxiga Take 1 tablet (10 mg total) by mouth daily before breakfast.   glipiZIDE 10 MG tablet Commonly known as: GLUCOTROL Take 1 tablet (10 mg total) by mouth 2 (two) times daily.   lisinopril-hydrochlorothiazide 20-12.5 MG tablet Commonly known as: ZESTORETIC Take 1 tablet by mouth daily.   metFORMIN 500 MG 24 hr tablet Commonly known as: GLUCOPHAGE-XR Take 2 tablets (1,000 mg total) by mouth 2 (two) times daily.        History (reviewed): Past Medical History:  Diagnosis Date   Cataract    Diabetes mellitus without complication (HCC)    Hypertension    Osteopenia 04/28/2021   Past Surgical History:  Procedure Laterality Date   EYE SURGERY     Family History  Problem Relation Age of Onset   Diabetes Mother    Throat cancer Brother    Social History   Socioeconomic History   Marital status: Married    Spouse name: Not on file   Number of children: Not on file   Years of education: Not on file   Highest education level: Not on file  Occupational History   Not on file  Tobacco Use   Smoking status: Former    Types: Cigarettes    Quit date: 1995  Years since quitting: 27.6   Smokeless tobacco: Never  Vaping Use   Vaping Use: Never used  Substance and Sexual Activity   Alcohol use: Not Currently   Drug use: Never   Sexual activity: Not on file  Other Topics Concern   Not on file  Social History Narrative   Not on file   Social Determinants of Health   Financial Resource Strain: Not on file  Food Insecurity: Not on file  Transportation Needs: Not on file  Physical Activity: Not on file  Stress: Not on file  Social Connections: Not on file    Activities of Daily Living In your  present state of health, do you have any difficulty performing the following activities: 05/25/2021  Hearing? Y  Vision? N  Difficulty concentrating or making decisions? Y  Walking or climbing stairs? N  Dressing or bathing? N  Doing errands, shopping? Y  Preparing Food and eating ? Y  Using the Toilet? N  In the past six months, have you accidently leaked urine? N  Do you have problems with loss of bowel control? N  Managing your Medications? Y  Managing your Finances? Y  Housekeeping or managing your Housekeeping? Y    Patient Education/ Literacy How often do you need to have someone help you when you read instructions, pamphlets, or other written materials from your doctor or pharmacy?: 5 - Always  Exercise Current Exercise Habits: The patient does not participate in regular exercise at present, Exercise limited by: psychological condition(s)  Diet Patient reports consuming 3 meals a day and 2 snack(s) a day Patient reports that her primary diet is: Regular Patient reports that she does have regular access to food.   Depression Screen PHQ 2/9 Scores 04/27/2021 10/28/2020  PHQ - 2 Score 0 0  PHQ- 9 Score 4 -     Fall Risk Fall Risk  05/25/2021 04/27/2021 10/28/2020  Falls in the past year? 0 0 0     Objective:  Barbara Schroeder seemed alert and oriented and she participated appropriately during our telephone visit.  Blood Pressure Weight BMI  BP Readings from Last 3 Encounters:  04/27/21 115/71  10/28/20 106/68   Wt Readings from Last 3 Encounters:  04/27/21 132 lb (59.9 kg)  10/28/20 125 lb 6.4 oz (56.9 kg)   BMI Readings from Last 1 Encounters:  04/27/21 24.94 kg/m    *Unable to obtain current vital signs, weight, and BMI due to telephone visit type  Hearing/Vision  B[place] did  seem to have difficulty with hearing/understanding during the telephone conversation Reports that she has not had a formal eye exam by an eye care professional within the past year Reports that  she has not had a formal hearing evaluation within the past year *Unable to fully assess hearing and vision during telephone visit type  Cognitive Function: 6CIT Screen 05/25/2021  What Year? (No Data)   (Normal:0-7, Significant for Dysfunction: >8)  Normal Cognitive Function Screening: No: Unable to complete. Husband assisted during telephone visit. Pt would not communicate. She is special needs. Excused her self during call per husband.   Immunization & Health Maintenance Record Immunization History  Administered Date(s) Administered   Influenza, Seasonal, Injecte, Preservative Fre 08/01/2016   Influenza,inj,Quad PF,6+ Mos 08/08/2018, 05/21/2019   Influenza-Unspecified 07/30/2020   PFIZER(Purple Top)SARS-COV-2 Vaccination 10/25/2019, 11/15/2019, 07/22/2020   Pneumococcal Conjugate-13 02/06/2019   Pneumococcal Polysaccharide-23 01/09/2018   Tdap 12/02/2019    Health Maintenance  Topic Date Due   COVID-19 Vaccine (4 -  Booster for [employer] series) 11/19/2020   INFLUENZA VACCINE  06/13/2021 (Originally 04/19/2021)   Zoster Vaccines- Shingrix (1 of 2) 07/28/2021 (Originally 09/09/2003)   COLONOSCOPY (Pts 45-78yrs Insurance coverage will need to be confirmed)  04/27/2022 (Originally 09/08/1998)   OPHTHALMOLOGY EXAM  07/28/2021   HEMOGLOBIN A1C  10/28/2021   FOOT EXAM  04/27/2022   PNA vac Low Risk Adult (2 of 2 - PPSV23) 01/10/2023   DEXA SCAN  04/27/2024   TETANUS/TDAP  12/01/2029   Hepatitis C Screening  Completed   HPV VACCINES  Aged Out   MAMMOGRAM  Discontinued       Assessment  This is a routine wellness examination for [employer].  Health Maintenance: Due or Overdue Health Maintenance Due  Topic Date Due   COVID-19 Vaccine (4 - Booster for [employer] series) 11/19/2020    Barbara Schroeder does not need a referral for Community Assistance: Care Management:   no Social Work:    no Prescription Assistance:  no Nutrition/Diabetes Education:  no   Plan:   Personalized Goals  Goals Addressed   None    Personalized Health Maintenance & Screening Recommendations   2nd Covid Booster  Lung Cancer Screening Recommended: no (Low Dose CT Chest recommended if Age 82-80 years, 30 pack-year currently smoking OR have quit w/in past 15 years) Hepatitis C Screening recommended: no HIV Screening recommended: no  Advanced Directives: Written information was not prepared per patient's request.  Referrals & Orders No orders of the defined types were placed in this encounter.   Follow-up Plan Follow-up with Gwenlyn Fudge, FNP as planned Schedule 07/28/2021   I have personally reviewed and noted the following in the patient's chart:   Medical and social history Use of alcohol, tobacco or illicit drugs  Current medications and supplements Functional ability and status Nutritional status Physical activity Advanced directives List of other physicians Hospitalizations, surgeries, and ER visits in previous 12 months Vitals Screenings to include cognitive, depression, and falls Referrals and appointments  In addition, I have reviewed and discussed with Barbara Schroeder certain preventive protocols, quality metrics, and best practice recommendations. A written personalized care plan for preventive services as well as general preventive health recommendations is available and can be mailed to the patient at her request.      Dorene Sorrow, LPN  05/22/2354

## 2021-05-25 NOTE — Patient Instructions (Signed)
  MEDICARE ANNUAL WELLNESS VISIT Health Maintenance Summary and Written Plan of Care  Barbara Schroeder ,  Thank you for allowing me to perform your Medicare Annual Wellness Visit and for your ongoing commitment to your health.   Health Maintenance & Immunization History Health Maintenance  Topic Date Due   COVID-19 Vaccine (4 - Booster for Pfizer series) 11/19/2020   INFLUENZA VACCINE  06/13/2021 (Originally 04/19/2021)   Zoster Vaccines- Shingrix (1 of 2) 07/28/2021 (Originally 09/09/2003)   COLONOSCOPY (Pts 45-74yrs Insurance coverage will need to be confirmed)  04/27/2022 (Originally 09/08/1998)   OPHTHALMOLOGY EXAM  07/28/2021   HEMOGLOBIN A1C  10/28/2021   FOOT EXAM  04/27/2022   PNA vac Low Risk Adult (2 of 2 - PPSV23) 01/10/2023   DEXA SCAN  04/27/2024   TETANUS/TDAP  12/01/2029   Hepatitis C Screening  Completed   HPV VACCINES  Aged Out   MAMMOGRAM  Discontinued   Immunization History  Administered Date(s) Administered   Influenza, Seasonal, Injecte, Preservative Fre 08/01/2016   Influenza,inj,Quad PF,6+ Mos 08/08/2018, 05/21/2019   Influenza-Unspecified 07/30/2020   PFIZER(Purple Top)SARS-COV-2 Vaccination 10/25/2019, 11/15/2019, 07/22/2020   Pneumococcal Conjugate-13 02/06/2019   Pneumococcal Polysaccharide-23 01/09/2018   Tdap 12/02/2019    These are the patient goals that we discussed:  Goals Addressed   None      This is a list of Health Maintenance Items that are overdue or due now: Health Maintenance Due  Topic Date Due   COVID-19 Vaccine (4 - Booster for Pfizer series) 11/19/2020     Orders/Referrals Placed Today: No orders of the defined types were placed in this encounter.  (Contact our referral department at [phone] if you have not spoken with someone about your referral appointment within the next 5 days)    Follow-up Plan  Scheduled with [name], FNP 07/28/21 at 1:35 pm.

## 2021-05-27 NOTE — Progress Notes (Signed)
I have reviewed and agree with the above AWV documentation.   [name], MSN, APRN, FNP-C [facility] 05/27/21

## 2021-06-02 NOTE — Progress Notes (Signed)
Received notification from [place] regarding approval for [facility] 10MG . Patient assistance approved from 05/28/21 to 09/18/21.  Phone: [phone]

## 2021-06-14 ENCOUNTER — Other Ambulatory Visit: Payer: Self-pay | Admitting: Family Medicine

## 2021-06-15 ENCOUNTER — Ambulatory Visit (INDEPENDENT_AMBULATORY_CARE_PROVIDER_SITE_OTHER): Payer: Medicare Other | Admitting: Pharmacist

## 2021-06-15 ENCOUNTER — Other Ambulatory Visit: Payer: Self-pay | Admitting: Family Medicine

## 2021-06-15 DIAGNOSIS — E1165 Type 2 diabetes mellitus with hyperglycemia: Secondary | ICD-10-CM

## 2021-06-16 ENCOUNTER — Telehealth: Payer: Self-pay | Admitting: Family Medicine

## 2021-06-16 NOTE — Progress Notes (Signed)
Chronic Care Management Pharmacy Note  06/15/2021 Name:  Barbara Schroeder MRN:  [id number] DOB:  August 03, 1951  Summary: T2DM  Recommendations/Changes made from today's visit: Diabetes: Uncontrolled-recent increase in a1c TO 7.4%; current treatment:metformin, glipizide,FARXIGA;  Patient cannot afford jardiance (tolerated samples well) CONTINUE  farxiga 10MG DAILY for ease of patient assistance (ASSISTANCE APPROVED, PATIENT RECEIVED SHIPMENT) Current glucose readings: fasting glucose: <160-170, post prandial glucose: <200 Denies hypoglycemic/hyperglycemic symptoms Discussed meal planning options and Plate method for healthy eating Avoid sugary drinks and desserts Incorporate balanced protein, non starchy veggies, 1 serving of carbohydrate with each meal Increase water intake Increase physical activity as able  Current exercise: N/A Educated on [place] patient finances. APPLICATION SUBMITTED FOR FARXIGA ([place] PATIENT APPROVED   Follow Up Plan: Telephone follow up appointment with care management team member scheduled for: 1 MONTH  Subjective: Barbara Schroeder is an 70 y.o. year old female who is a primary patient of Loman Brooklyn, FNP.  The CCM team was consulted for assistance with disease management and care coordination needs.    Engaged with patient by telephone for follow up visit in response to provider referral for pharmacy case management and/or care coordination services.   Consent to Services:  The patient was given information about Chronic Care Management services, agreed to services, and gave verbal consent prior to initiation of services.  Please see initial visit note for detailed documentation.   Patient Care Team: Loman Brooklyn, FNP as PCP - General (Family Medicine) Lavera Guise, [facility] as Pharmacist (Family Medicine)  Objective:  Lab Results  Component Value Date   CREATININE 0.81 04/27/2021   CREATININE 0.71  10/28/2020    Lab Results  Component Value Date   HGBA1C 7.5 (H) 04/27/2021   Last diabetic Eye exam:  Lab Results  Component Value Date/Time   HMDIABEYEEXA Retinopathy (A) 07/28/2020 12:00 AM    Last diabetic Foot exam: No results found for: HMDIABFOOTEX      Component Value Date/Time   CHOL 84 (L) 04/27/2021 1339   TRIG 198 (H) 04/27/2021 1339   HDL 32 (L) 04/27/2021 1339   CHOLHDL 2.6 04/27/2021 1339   LDLCALC 21 04/27/2021 1339    Hepatic Function Latest Ref Rng & Units 04/27/2021 10/28/2020  Total Protein 6.0 - 8.5 g/dL 6.3 6.4  Albumin 3.8 - 4.8 g/dL 4.1 4.4  AST 0 - 40 IU/L 20 25  ALT 0 - 32 IU/L 31 32  Alk Phosphatase 44 - 121 IU/L 53 61  Total Bilirubin 0.0 - 1.2 mg/dL 0.6 0.4    No results found for: TSH, FREET4  CBC Latest Ref Rng & Units 04/27/2021 10/28/2020  WBC 3.4 - 10.8 x10E3/uL 5.2 7.1  Hemoglobin 11.1 - 15.9 g/dL 13.5 13.1  Hematocrit 34.0 - 46.6 % 40.3 39.3  Platelets 150 - 450 x10E3/uL 159 195    No results found for: VD25OH  Clinical ASCVD: No  The ASCVD Risk score (Arnett DK, et al., 2019) failed to calculate for the following reasons:   The valid total cholesterol range is 130 to 320 mg/dL    Other: (CHADS2VASc if Afib, PHQ9 if depression, MMRC or CAT for COPD, ACT, DEXA)  Social History   Tobacco Use  Smoking Status Former   Types: Cigarettes   Quit date: 1995   Years since quitting: 27.7  Smokeless Tobacco Never   BP Readings from Last 3 Encounters:  04/27/21 115/71  10/28/20 106/68   Pulse Readings from Last 3  Encounters:  04/27/21 71  10/28/20 63   Wt Readings from Last 3 Encounters:  04/27/21 132 lb (59.9 kg)  10/28/20 125 lb 6.4 oz (56.9 kg)    Assessment: Review of patient past medical history, allergies, medications, health status, including review of consultants reports, laboratory and other test data, was performed as part of comprehensive evaluation and provision of chronic care management services.   SDOH:  (Social  Determinants of Health) assessments and interventions performed:    CCM Care Plan  No Known Allergies  Medications Reviewed Today     Reviewed by Alphonzo Dublin, LPN (Licensed Practical Nurse) on 05/25/21 at 1617  Med List Status: <None>   Medication Order Taking? Sig Documenting Provider Last Dose Status Informant  atenolol (TENORMIN) 50 MG tablet [id number] Yes Take 1 tablet (50 mg total) by mouth daily. Loman Brooklyn, FNP Taking Active   atorvastatin (LIPITOR) 20 MG tablet [id number] Yes Take 1 tablet (20 mg total) by mouth daily. Loman Brooklyn, FNP Taking Active   dapagliflozin propanediol (FARXIGA) 10 MG TABS tablet [id number] Yes Take 1 tablet (10 mg total) by mouth daily before breakfast. Loman Brooklyn, FNP Taking Active   glipiZIDE (GLUCOTROL) 10 MG tablet [id number] Yes Take 1 tablet (10 mg total) by mouth 2 (two) times daily. Loman Brooklyn, FNP Taking Active   lisinopril-hydrochlorothiazide (ZESTORETIC) 20-12.5 MG tablet [id number] Yes Take 1 tablet by mouth daily. Loman Brooklyn, FNP Taking Active   metFORMIN (GLUCOPHAGE-XR) 500 MG 24 hr tablet [id number] Yes Take 2 tablets (1,000 mg total) by mouth 2 (two) times daily. Loman Brooklyn, FNP Taking Active             Patient Active Problem List   Diagnosis Date Noted   Osteopenia 04/28/2021   Controlled type 2 diabetes mellitus without complication, without long-term current use of insulin ([place]) 10/28/2020   Essential hypertension 10/28/2020   Nonverbal 10/28/2020    Immunization History  Administered Date(s) Administered   Influenza, Seasonal, Injecte, Preservative Fre 08/01/2016   Influenza,inj,Quad PF,6+ Mos 08/08/2018, 05/21/2019   Influenza-Unspecified 07/30/2020   PFIZER(Purple Top)SARS-COV-2 Vaccination 10/25/2019, 11/15/2019, 07/22/2020   Pneumococcal Conjugate-13 02/06/2019   Pneumococcal Polysaccharide-23 01/09/2018   Tdap 12/02/2019    Conditions to be addressed/monitored: DMII  Care Plan :  PHARMD MEDICATION MANAGEMENT  Updates made by Lavera Guise, [place] since 06/16/2021 12:00 AM     Problem: DISEASE PROGRESSION PREVENTION      Long-Range Goal: T2DM   Recent Progress: Not on track  Priority: High  Note:   Current Barriers:  Unable to independently afford treatment regimen Unable to maintain control of T2DM Suboptimal therapeutic regimen for T2DM Patient is non-verbal; husband communicates on her behalf  Pharmacist Clinical Goal(s):  Over the next 90 days, patient will verbalize ability to afford treatment regimen maintain control of t2dm as evidenced by improved glycemic control  through collaboration with PharmD and provider.    Interventions: 1:1 collaboration with Loman Brooklyn, FNP regarding development and update of comprehensive plan of care as evidenced by provider attestation and co-signature Inter-disciplinary care team collaboration (see longitudinal plan of care) Comprehensive medication review performed; medication list updated in electronic medical record  Diabetes: Uncontrolled-recent increase in a1c TO 7.4%; current treatment:metformin, glipizide,FARXIGA;  Patient cannot afford jardiance (tolerated samples well) CONTINUE  farxiga 10MG DAILY for ease of patient assistance (ASSISTANCE APPROVED, PATIENT RECEIVED SHIPMENT Current glucose readings: fasting glucose: <160-170, post prandial glucose: <200 Denies hypoglycemic/hyperglycemic symptoms Discussed meal planning options  and Plate method for healthy eating Avoid sugary drinks and desserts Incorporate balanced protein, non starchy veggies, 1 serving of carbohydrate with each meal Increase water intake Increase physical activity as able  Current exercise: N/A Educated on [place] patient finances. APPLICATION SUBMITTED FOR FARXIGA ([place] PATIENT APPROVED    Patient Goals/Self-Care Activities Over the next 90 days, patient will:  - take medications as  prescribed check glucose DAILY (FASTING), document, and provide at future appointments collaborate with provider on medication access solutions  Follow Up Plan: Telephone follow up appointment with care management team member scheduled for: 1 MONTH      Medication Assistance:  FARXIGA obtained through [place] medication [place].  Enrollment ends 09/18/21  Patient's preferred pharmacy is:  [facility] [place], [place] [address] [place] [address] Phone: [phone] Fax: [place], [place] [address] [place] [address] Phone: [phone] Fax: [phone]   Follow Up:  Patient agrees to Care Plan and Follow-up.  Plan: Telephone follow up appointment with care management team member scheduled for:  08/2021  Regina Eck, PharmD, BCPS Clinical Pharmacist, [place]  II Phone [phone]

## 2021-06-16 NOTE — Patient Instructions (Signed)
Visit Information  PATIENT GOALS:  Goals Addressed               This Visit's Progress     Patient Stated     T2DM PHARMD GOAL (pt-stated)        Current Barriers:  Unable to independently afford treatment regimen Unable to maintain control of T2DM Suboptimal therapeutic regimen for T2DM Patient is non-verbal; husband communicates on her behalf  Pharmacist Clinical Goal(s):  Over the next 90 days, patient will verbalize ability to afford treatment regimen maintain control of t2dm as evidenced by improved glycemic control  through collaboration with PharmD and provider.    Interventions: 1:1 collaboration with Gwenlyn Fudge, FNP regarding development and update of comprehensive plan of care as evidenced by provider attestation and co-signature Inter-disciplinary care team collaboration (see longitudinal plan of care) Comprehensive medication review performed; medication list updated in electronic medical record  Diabetes: Uncontrolled-recent increase in a1c TO 7.4%; current treatment:metformin, glipizide,FARXIGA;  Patient cannot afford jardiance (tolerated samples well) CONTINUE  farxiga 10MG  DAILY for ease of patient assistance (ASSISTANCE APPROVED, PATIENT RECEIVED SHIPMENT Current glucose readings: fasting glucose: <160-170, post prandial glucose: <200 Denies hypoglycemic/hyperglycemic symptoms Discussed meal planning options and Plate method for healthy eating Avoid sugary drinks and desserts Incorporate balanced protein, non starchy veggies, 1 serving of carbohydrate with each meal Increase water intake Increase physical activity as able  Current exercise: N/A Educated on TO FARXIGA TRANSITION Assessed patient finances. APPLICATION SUBMITTED FOR FARXIGA ([place]) PATIENT APPROVED    Patient Goals/Self-Care Activities Over the next 90 days, patient will:  - take medications as prescribed check glucose DAILY (FASTING), document, and provide at future  appointments collaborate with provider on medication access solutions  Follow Up Plan: Telephone follow up appointment with care management team member scheduled for: 1 MONTH         The patient verbalized understanding of instructions, educational materials, and care plan provided today and declined offer to receive copy of patient instructions, educational materials, and care plan.   Telephone follow up appointment with care management team member scheduled for:  Signature [employer], PharmD, BCPS Clinical Pharmacist, [facility] Family [facility]  II Phone [phone]

## 2021-06-18 DIAGNOSIS — E1165 Type 2 diabetes mellitus with hyperglycemia: Secondary | ICD-10-CM

## 2021-06-23 NOTE — Telephone Encounter (Signed)
Samples given of farxiga last week Patient assistance supply should be delivered to home soon Please check in with patient to see if they received or need additional samples

## 2021-06-23 NOTE — Telephone Encounter (Signed)
Attempted to contact patient - NA °

## 2021-06-28 NOTE — Telephone Encounter (Signed)
Husband states they have received medication and was 90 day supply

## 2021-07-05 ENCOUNTER — Other Ambulatory Visit: Payer: Self-pay | Admitting: Family Medicine

## 2021-07-05 DIAGNOSIS — I10 Essential (primary) hypertension: Secondary | ICD-10-CM

## 2021-07-05 DIAGNOSIS — E119 Type 2 diabetes mellitus without complications: Secondary | ICD-10-CM

## 2021-07-21 ENCOUNTER — Ambulatory Visit (INDEPENDENT_AMBULATORY_CARE_PROVIDER_SITE_OTHER): Payer: Medicare Other | Admitting: Pharmacist

## 2021-07-21 DIAGNOSIS — E119 Type 2 diabetes mellitus without complications: Secondary | ICD-10-CM

## 2021-07-21 NOTE — Patient Instructions (Signed)
Visit Information  The patient verbalized understanding of instructions, educational materials, and care plan provided today and declined offer to receive copy of patient instructions, educational materials, and care plan.   Telephone follow up appointment with care management team member scheduled for: 08/2021  Signature Kieth Brightly, PharmD, BCPS Clinical Pharmacist, [facility] Family Medicine [facility]  II Phone [phone]

## 2021-07-21 NOTE — Progress Notes (Signed)
Chronic Care Management Pharmacy Note  07/21/2021 Name:  Barbara Schroeder MRN:  [id number] DOB:  22-May-1951  Summary: T2DM  Recommendations/Changes made from today's visit: Diabetes: Uncontrolled-recent increase in A1c TO 7.5%; current treatment: metformin, glipizide, FARXIGA;  Patient cannot afford jardiance (tolerated samples well) CONTINUE  farxiga 10MG DAILY for ease of patient assistance (ASSISTANCE APPROVED via [place], PATIENT RECEIVED SHIPMENT) Tolerating well per husband (patient is non verbal) Will re-enroll for December patient assistance Current glucose readings: fasting glucose: <160-170, post prandial glucose: <200 Denies hypoglycemic/hyperglycemic symptoms Discussed meal planning options and Plate method for healthy eating Avoid sugary drinks and desserts Incorporate balanced protein, non starchy veggies, 1 serving of carbohydrate with each meal Increase water intake Increase physical activity as able  Current exercise: N/A Educated on [place] patient finances. APPLICATION SUBMITTED FOR FARXIGA ([place]) PATIENT APPROVED--need to re-enroll for next year   Patient Goals/Self-Care Activities Over the next 90 days, patient will:  - take medications as prescribed check glucose DAILY (FASTING), document, and provide at future appointments collaborate with provider on medication access solutions  Follow Up Plan: Telephone follow up appointment with care management team member scheduled for: 1 MONTH  Subjective: Barbara Schroeder is an 70 y.o. year old female who is a primary patient of Loman Brooklyn, FNP.  The CCM team was consulted for assistance with disease management and care coordination needs.    Engaged with patient by telephone for follow up visit in response to provider referral for pharmacy case management and/or care coordination services.   Consent to Services:  The patient was given information about Chronic Care  Management services, agreed to services, and gave verbal consent prior to initiation of services.  Please see initial visit note for detailed documentation.   Patient Care Team: Loman Brooklyn, FNP as PCP - General (Family Medicine) Lavera Guise, [facility] as Pharmacist (Family Medicine)  Objective:  Lab Results  Component Value Date   CREATININE 0.81 04/27/2021   CREATININE 0.71 10/28/2020    Lab Results  Component Value Date   HGBA1C 7.5 (H) 04/27/2021   Last diabetic Eye exam:  Lab Results  Component Value Date/Time   HMDIABEYEEXA Retinopathy (A) 07/28/2020 12:00 AM    Last diabetic Foot exam: No results found for: HMDIABFOOTEX      Component Value Date/Time   CHOL 84 (L) 04/27/2021 1339   TRIG 198 (H) 04/27/2021 1339   HDL 32 (L) 04/27/2021 1339   CHOLHDL 2.6 04/27/2021 1339   LDLCALC 21 04/27/2021 1339    Hepatic Function Latest Ref Rng & Units 04/27/2021 10/28/2020  Total Protein 6.0 - 8.5 g/dL 6.3 6.4  Albumin 3.8 - 4.8 g/dL 4.1 4.4  AST 0 - 40 IU/L 20 25  ALT 0 - 32 IU/L 31 32  Alk Phosphatase 44 - 121 IU/L 53 61  Total Bilirubin 0.0 - 1.2 mg/dL 0.6 0.4    No results found for: TSH, FREET4  CBC Latest Ref Rng & Units 04/27/2021 10/28/2020  WBC 3.4 - 10.8 x10E3/uL 5.2 7.1  Hemoglobin 11.1 - 15.9 g/dL 13.5 13.1  Hematocrit 34.0 - 46.6 % 40.3 39.3  Platelets 150 - 450 x10E3/uL 159 195    No results found for: VD25OH  Clinical ASCVD: No  The ASCVD Risk score (Arnett DK, et al., 2019) failed to calculate for the following reasons:   The valid total cholesterol range is 130 to 320 mg/dL    Other: (CHADS2VASc if Afib, PHQ9 if depression, MMRC  or CAT for COPD, ACT, DEXA)  Social History   Tobacco Use  Smoking Status Former   Types: Cigarettes   Quit date: 1995   Years since quitting: 27.8  Smokeless Tobacco Never   BP Readings from Last 3 Encounters:  04/27/21 115/71  10/28/20 106/68   Pulse Readings from Last 3 Encounters:  04/27/21 71  10/28/20 63    Wt Readings from Last 3 Encounters:  04/27/21 132 lb (59.9 kg)  10/28/20 125 lb 6.4 oz (56.9 kg)    Assessment: Review of patient past medical history, allergies, medications, health status, including review of consultants reports, laboratory and other test data, was performed as part of comprehensive evaluation and provision of chronic care management services.   SDOH:  (Social Determinants of Health) assessments and interventions performed:    CCM Care Plan  No Known Allergies  Medications Reviewed Today     Reviewed by Alphonzo Dublin, LPN (Licensed Practical Nurse) on 05/25/21 at 1617  Med List Status: <None>   Medication Order Taking? Sig Documenting Provider Last Dose Status Informant  atenolol (TENORMIN) 50 MG tablet [id number] Yes Take 1 tablet (50 mg total) by mouth daily. Loman Brooklyn, FNP Taking Active   atorvastatin (LIPITOR) 20 MG tablet [id number] Yes Take 1 tablet (20 mg total) by mouth daily. Loman Brooklyn, FNP Taking Active   dapagliflozin propanediol (FARXIGA) 10 MG TABS tablet [id number] Yes Take 1 tablet (10 mg total) by mouth daily before breakfast. Loman Brooklyn, FNP Taking Active   glipiZIDE (GLUCOTROL) 10 MG tablet [id number] Yes Take 1 tablet (10 mg total) by mouth 2 (two) times daily. Loman Brooklyn, FNP Taking Active   lisinopril-hydrochlorothiazide (ZESTORETIC) 20-12.5 MG tablet [id number] Yes Take 1 tablet by mouth daily. Loman Brooklyn, FNP Taking Active   metFORMIN (GLUCOPHAGE-XR) 500 MG 24 hr tablet [id number] Yes Take 2 tablets (1,000 mg total) by mouth 2 (two) times daily. Loman Brooklyn, FNP Taking Active             Patient Active Problem List   Diagnosis Date Noted   Osteopenia 04/28/2021   Controlled type 2 diabetes mellitus without complication, without long-term current use of insulin ([place]) 10/28/2020   Essential hypertension 10/28/2020   Nonverbal 10/28/2020    Immunization History  Administered Date(s) Administered    Influenza, Seasonal, Injecte, Preservative Fre 08/01/2016   Influenza,inj,Quad PF,6+ Mos 08/08/2018, 05/21/2019   Influenza-Unspecified 07/30/2020   PFIZER(Purple Top)SARS-COV-2 Vaccination 10/25/2019, 11/15/2019, 07/22/2020   Pneumococcal Conjugate-13 02/06/2019   Pneumococcal Polysaccharide-23 01/09/2018   Tdap 12/02/2019    Conditions to be addressed/monitored: DMII  Care Plan : PHARMD MEDICATION MANAGEMENT  Updates made by Lavera Guise, [place] since 07/21/2021 12:00 AM     Problem: DISEASE PROGRESSION PREVENTION      Long-Range Goal: T2DM   Recent Progress: Not on track  Priority: High  Note:   Current Barriers:  Unable to independently afford treatment regimen Unable to maintain control of T2DM Suboptimal therapeutic regimen for T2DM Patient is non-verbal; husband communicates on her behalf  Pharmacist Clinical Goal(s):  Over the next 90 days, patient will verbalize ability to afford treatment regimen maintain control of t2dm as evidenced by improved glycemic control  through collaboration with PharmD and provider.    Interventions: 1:1 collaboration with Loman Brooklyn, FNP regarding development and update of comprehensive plan of care as evidenced by provider attestation and co-signature Inter-disciplinary care team collaboration (see longitudinal plan of care) Comprehensive medication review performed; medication  list updated in electronic medical record  Diabetes: Uncontrolled-recent increase in A1c TO 7.5%; current treatment: metformin, glipizide, FARXIGA;  Patient cannot afford jardiance (tolerated samples well) CONTINUE  farxiga 10MG DAILY for ease of patient assistance (ASSISTANCE APPROVED via [place], PATIENT RECEIVED SHIPMENT) Tolerating well per husband (patient is non verbal) Current glucose readings: fasting glucose: <160-170, post prandial glucose: <200 Denies hypoglycemic/hyperglycemic symptoms Discussed meal planning options and Plate method for  healthy eating Avoid sugary drinks and desserts Incorporate balanced protein, non starchy veggies, 1 serving of carbohydrate with each meal Increase water intake Increase physical activity as able  Current exercise: N/A Educated on [place] patient finances. APPLICATION SUBMITTED FOR FARXIGA ([place]) PATIENT APPROVED--need to re-enroll for next year    Patient Goals/Self-Care Activities Over the next 90 days, patient will:  - take medications as prescribed check glucose DAILY (FASTING), document, and provide at future appointments collaborate with provider on medication access solutions  Follow Up Plan: Telephone follow up appointment with care management team member scheduled for: 1 MONTH      Medication Assistance:  FARXIGA obtained through [place] medication assistance program.  Enrollment ends 09/18/21  Patient's preferred pharmacy is:  [facility] [place], [place] [address] [place] [address] Phone: [phone] Fax: [place], [place] [address] [place] [address] Phone: [phone] Fax: [phone]  Follow Up:  Patient agrees to Care Plan and Follow-up.  Plan: Telephone follow up appointment with care management team member scheduled for:  08/2021    Regina Eck, PharmD, BCPS Clinical Pharmacist, [place]  II Phone [phone]

## 2021-07-28 ENCOUNTER — Other Ambulatory Visit: Payer: Self-pay

## 2021-07-28 ENCOUNTER — Ambulatory Visit (INDEPENDENT_AMBULATORY_CARE_PROVIDER_SITE_OTHER): Payer: Medicare Other | Admitting: Family Medicine

## 2021-07-28 ENCOUNTER — Encounter: Payer: Self-pay | Admitting: Family Medicine

## 2021-07-28 DIAGNOSIS — R509 Fever, unspecified: Secondary | ICD-10-CM

## 2021-07-28 LAB — VERITOR FLU A/B WAIVED
Influenza A: NEGATIVE
Influenza B: NEGATIVE

## 2021-07-28 NOTE — Progress Notes (Signed)
   Virtual Visit via Telephone Note  I connected with Barbara Schroeder on 07/28/21 at 1:15 PM by telephone and verified that I am speaking with the correct person using two identifiers. Barbara Schroeder is currently located at home and nobody is currently with her during this visit. The provider, Gwenlyn Fudge, FNP is located in their office at time of visit.  I discussed the limitations, risks, security and privacy concerns of performing an evaluation and management service by telephone and the availability of in person appointments. I also discussed with the patient that there may be a patient responsible charge related to this service. The patient expressed understanding and agreed to proceed.  Subjective: PCP: Gwenlyn Fudge, FNP  Chief Complaint  Patient presents with   URI   Patient complains of cough, chest congestion, and fever. Onset of symptoms was a few days ago, gradually improving since that time. She is drinking plenty of fluids. Evaluation to date: none. Treatment to date: none.  She does not smoke.    ROS: Per HPI  Current Outpatient Medications:    atenolol (TENORMIN) 50 MG tablet, TAKE ONE TABLET BY MOUTH DAILY, Disp: 90 tablet, Rfl: 0   atorvastatin (LIPITOR) 20 MG tablet, TAKE ONE TABLET BY MOUTH DAILY, Disp: 90 tablet, Rfl: 0   dapagliflozin propanediol (FARXIGA) 10 MG TABS tablet, Take 1 tablet (10 mg total) by mouth daily before breakfast., Disp: 90 tablet, Rfl: 3   glipiZIDE (GLUCOTROL) 10 MG tablet, Take 1 tablet (10 mg total) by mouth 2 (two) times daily., Disp: 180 tablet, Rfl: 1   lisinopril-hydrochlorothiazide (ZESTORETIC) 20-12.5 MG tablet, TAKE ONE TABLET BY MOUTH DAILY, Disp: 90 tablet, Rfl: 0   metFORMIN (GLUCOPHAGE-XR) 500 MG 24 hr tablet, TAKE TWO TABLETS BY MOUTH TWICE A DAY, Disp: 180 tablet, Rfl: 0  No Known Allergies Past Medical History:  Diagnosis Date   Cataract    Diabetes mellitus without complication (HCC)    Hypertension    Osteopenia  04/28/2021    Observations/Objective: A&O  No respiratory distress or wheezing audible over the phone Mood, judgement, and thought processes all WNL  Assessment and Plan: 1. Febrile illness Symptom management. - Novel Coronavirus, NAA ([facility]); Future - Veritor Flu A/B Waived; Future   Follow Up Instructions:  I discussed the assessment and treatment plan with the patient. The patient was provided an opportunity to ask questions and all were answered. The patient agreed with the plan and demonstrated an understanding of the instructions.   The patient was advised to call back or seek an in-person evaluation if the symptoms worsen or if the condition fails to improve as anticipated.  The above assessment and management plan was discussed with the patient. The patient verbalized understanding of and has agreed to the management plan. Patient is aware to call the clinic if symptoms persist or worsen. Patient is aware when to return to the clinic for a follow-up visit. Patient educated on when it is appropriate to go to the emergency department.   Time call ended: 1:26 PM  I provided 11 minutes of non-face-to-face time during this encounter.  [name], MSN, APRN, FNP-C [facility] 07/28/21

## 2021-07-29 LAB — SARS-COV-2, NAA 2 DAY TAT

## 2021-07-29 LAB — NOVEL CORONAVIRUS, NAA: SARS-CoV-2, NAA: NOT DETECTED

## 2021-07-30 ENCOUNTER — Telehealth: Payer: Self-pay | Admitting: Family Medicine

## 2021-07-30 NOTE — Telephone Encounter (Signed)
This is viral = symptom management. Tylenol/Ibuprofen for aches, pain, fever. Mucinex for chest congestion. Delsym/Robitussin for cough.

## 2021-07-30 NOTE — Telephone Encounter (Signed)
Pt had a televisit on 11/9, was tested for covid and flu, both came back negative but she is not feeling any better and wants to know if something can be called in for her symptoms. If something can be called in they would like for it to go to the [facility] in [place], [place]. Please call back and advise.

## 2021-07-30 NOTE — Telephone Encounter (Signed)
Patient aware and verbalizes understanding. 

## 2021-08-07 ENCOUNTER — Other Ambulatory Visit: Payer: Self-pay | Admitting: Family Medicine

## 2021-08-19 ENCOUNTER — Ambulatory Visit (INDEPENDENT_AMBULATORY_CARE_PROVIDER_SITE_OTHER): Payer: Medicare Other | Admitting: Pharmacist

## 2021-08-19 DIAGNOSIS — I10 Essential (primary) hypertension: Secondary | ICD-10-CM

## 2021-08-19 DIAGNOSIS — E119 Type 2 diabetes mellitus without complications: Secondary | ICD-10-CM

## 2021-08-19 DIAGNOSIS — E1165 Type 2 diabetes mellitus with hyperglycemia: Secondary | ICD-10-CM

## 2021-08-19 MED ORDER — DAPAGLIFLOZIN PROPANEDIOL 10 MG PO TABS: 10.0000 mg | ORAL_TABLET | Freq: Every day | ORAL | 3 refills | Status: DC

## 2021-08-19 NOTE — Patient Instructions (Signed)
Visit Information  Thank you for taking time to visit with me today. Please don't hesitate to contact me if I can be of assistance to you before our next scheduled telephone appointment.  Following are the goals we discussed today:  Current Barriers:  Unable to independently afford treatment regimen Unable to maintain control of T2DM Suboptimal therapeutic regimen for T2DM Patient is non-verbal; husband communicates on her behalf  Pharmacist Clinical Goal(s):  Over the next 90 days, patient will verbalize ability to afford treatment regimen maintain control of t2dm as evidenced by improved glycemic control  through collaboration with PharmD and provider.    Interventions: 1:1 collaboration with Gwenlyn Fudge, FNP regarding development and update of comprehensive plan of care as evidenced by provider attestation and co-signature Inter-disciplinary care team collaboration (see longitudinal plan of care) Comprehensive medication review performed; medication list updated in electronic medical record  Diabetes: Uncontrolled-recent increase in A1c TO 7.5%; current treatment: metformin, glipizide, FARXIGA;  Patient cannot afford jardiance (tolerated samples well) CONTINUE  farxiga 10MG  DAILY for ease of patient assistance (ASSISTANCE APPROVED via [place], PATIENT RECEIVED SHIPMENT) Tolerating well per husband (patient is non verbal) Current glucose readings: fasting glucose: <160-170, post prandial glucose: <200 Denies hypoglycemic/hyperglycemic symptoms Discussed meal planning options and Plate method for healthy eating Avoid sugary drinks and desserts Incorporate balanced protein, non starchy veggies, 1 serving of carbohydrate with each meal Increase water intake Increase physical activity as able  Current exercise: N/A Educated on TO FARXIGA TRANSITION Assessed patient finances. APPLICATION RE-SUBMITTED FOR FARXIGA ([place]) 2023    Patient Goals/Self-Care Activities Over  the next 90 days, patient will:  - take medications as prescribed check glucose DAILY (FASTING), document, and provide at future appointments collaborate with provider on medication access solutions  Follow Up Plan: Telephone follow up appointment with care management team member scheduled for: 3 MONTH   Please call the care guide team at [phone] if you need to cancel or reschedule your appointment.   The patient verbalized understanding of instructions, educational materials, and care plan provided today and declined offer to receive copy of patient instructions, educational materials, and care plan.   Signature [phone], PharmD, BCPS Clinical Pharmacist, [facility]  II Phone [phone]

## 2021-08-19 NOTE — Progress Notes (Signed)
Chronic Care Management Pharmacy Note  08/19/2021 Name:  Barbara Schroeder MRN:  [id number] DOB:  03-Aug-1951  Summary: T2DM  Recommendations/Changes made from today's visit:  Diabetes: Uncontrolled-recent increase in A1c TO 7.5%; current treatment: metformin, glipizide, FARXIGA;  Patient cannot afford jardiance (tolerated samples well) CONTINUE  farxiga 10MG DAILY for ease of patient assistance (ASSISTANCE APPROVED via [place]&me, PATIENT RECEIVED SHIPMENT) Tolerating well per husband (patient is non verbal) Current glucose readings: fasting glucose: <160-170, post prandial glucose: <200 Denies hypoglycemic/hyperglycemic symptoms Discussed meal planning options and Plate method for healthy eating Avoid sugary drinks and desserts Incorporate balanced protein, non starchy veggies, 1 serving of carbohydrate with each meal Increase water intake Increase physical activity as able  Current exercise: N/A Educated on [place] patient finances. APPLICATION RE-SUBMITTED FOR FARXIGA ([place]) 2023    Patient Goals/Self-Care Activities Over the next 90 days, patient will:  - take medications as prescribed check glucose DAILY (FASTING), document, and provide at future appointments collaborate with provider on medication access solutions  Follow Up Plan: Telephone follow up appointment with care management team member scheduled for: 3 MONTHS  Subjective: Barbara Schroeder is an 70 y.o. year old female who is a primary patient of Loman Brooklyn, FNP.  The CCM team was consulted for assistance with disease management and care coordination needs.    Engaged with patient by telephone for follow up visit in response to provider referral for pharmacy case management and/or care coordination services.   Consent to Services:  The patient was given information about Chronic Care Management services, agreed to services, and gave verbal consent prior to initiation of  services.  Please see initial visit note for detailed documentation.   Patient Care Team: Loman Brooklyn, FNP as PCP - General (Family Medicine) Lavera Guise, [facility] as Pharmacist (Family Medicine)  Objective:  Lab Results  Component Value Date   CREATININE 0.81 04/27/2021   CREATININE 0.71 10/28/2020    Lab Results  Component Value Date   HGBA1C 7.5 (H) 04/27/2021   Last diabetic Eye exam:  Lab Results  Component Value Date/Time   HMDIABEYEEXA Retinopathy (A) 07/28/2020 12:00 AM    Last diabetic Foot exam: No results found for: HMDIABFOOTEX      Component Value Date/Time   CHOL 84 (L) 04/27/2021 1339   TRIG 198 (H) 04/27/2021 1339   HDL 32 (L) 04/27/2021 1339   CHOLHDL 2.6 04/27/2021 1339   LDLCALC 21 04/27/2021 1339    Hepatic Function Latest Ref Rng & Units 04/27/2021 10/28/2020  Total Protein 6.0 - 8.5 g/dL 6.3 6.4  Albumin 3.8 - 4.8 g/dL 4.1 4.4  AST 0 - 40 IU/L 20 25  ALT 0 - 32 IU/L 31 32  Alk Phosphatase 44 - 121 IU/L 53 61  Total Bilirubin 0.0 - 1.2 mg/dL 0.6 0.4    No results found for: TSH, FREET4  CBC Latest Ref Rng & Units 04/27/2021 10/28/2020  WBC 3.4 - 10.8 x10E3/uL 5.2 7.1  Hemoglobin 11.1 - 15.9 g/dL 13.5 13.1  Hematocrit 34.0 - 46.6 % 40.3 39.3  Platelets 150 - 450 x10E3/uL 159 195    No results found for: VD25OH  Clinical ASCVD: No  The ASCVD Risk score (Arnett DK, et al., 2019) failed to calculate for the following reasons:   The valid total cholesterol range is 130 to 320 mg/dL    Other: (CHADS2VASc if Afib, PHQ9 if depression, MMRC or CAT for COPD, ACT, DEXA)  Social History  Tobacco Use  Smoking Status Former   Types: Cigarettes   Quit date: 1995   Years since quitting: 27.9  Smokeless Tobacco Never   BP Readings from Last 3 Encounters:  04/27/21 115/71  10/28/20 106/68   Pulse Readings from Last 3 Encounters:  04/27/21 71  10/28/20 63   Wt Readings from Last 3 Encounters:  04/27/21 132 lb (59.9 kg)  10/28/20 125 lb 6.4  oz (56.9 kg)    Assessment: Review of patient past medical history, allergies, medications, health status, including review of consultants reports, laboratory and other test data, was performed as part of comprehensive evaluation and provision of chronic care management services.   SDOH:  (Social Determinants of Health) assessments and interventions performed:    CCM Care Plan  No Known Allergies  Medications Reviewed Today     Reviewed by Lavera Guise, [facility] (Pharmacist) on 08/19/21 at 1215  Med List Status: <None>   Medication Order Taking? Sig Documenting Provider Last Dose Status Informant  atenolol (TENORMIN) 50 MG tablet [id number]  TAKE ONE TABLET BY MOUTH DAILY Loman Brooklyn, FNP  Active   atorvastatin (LIPITOR) 20 MG tablet [id number]  TAKE ONE TABLET BY MOUTH DAILY Loman Brooklyn, FNP  Active   dapagliflozin propanediol (FARXIGA) 10 MG TABS tablet [id number]  Take 1 tablet (10 mg total) by mouth daily before breakfast. Loman Brooklyn, FNP  Active   glipiZIDE (GLUCOTROL) 10 MG tablet [id number] No Take 1 tablet (10 mg total) by mouth 2 (two) times daily. Hendricks Limes F, FNP Taking Active   lisinopril-hydrochlorothiazide (ZESTORETIC) 20-12.5 MG tablet [id number]  TAKE ONE TABLET BY MOUTH DAILY Loman Brooklyn, FNP  Active   metFORMIN (GLUCOPHAGE-XR) 500 MG 24 hr tablet [id number]  Take 2 tablets (1,000 mg total) by mouth 2 (two) times daily. (NEEDS TO BE SEEN BEFORE NEXT REFILL) Loman Brooklyn, FNP  Active             Patient Active Problem List   Diagnosis Date Noted   Osteopenia 04/28/2021   Controlled type 2 diabetes mellitus without complication, without long-term current use of insulin ([place]) 10/28/2020   Essential hypertension 10/28/2020   Nonverbal 10/28/2020    Immunization History  Administered Date(s) Administered   Influenza, Seasonal, Injecte, Preservative Fre 08/01/2016   Influenza,inj,Quad PF,6+ Mos 08/08/2018, 05/21/2019   Influenza-Unspecified  07/30/2020   PFIZER(Purple Top)SARS-COV-2 Vaccination 10/25/2019, 11/15/2019, 07/22/2020   Pneumococcal Conjugate-13 02/06/2019   Pneumococcal Polysaccharide-23 01/09/2018   Tdap 12/02/2019    Conditions to be addressed/monitored: DMII  Care Plan : PHARMD MEDICATION MANAGEMENT  Updates made by Lavera Guise, [place] since 08/19/2021 12:00 AM     Problem: DISEASE PROGRESSION PREVENTION      Long-Range Goal: T2DM   Recent Progress: Not on track  Priority: High  Note:   Current Barriers:  Unable to independently afford treatment regimen Unable to maintain control of T2DM Suboptimal therapeutic regimen for T2DM Patient is non-verbal; husband communicates on her behalf  Pharmacist Clinical Goal(s):  Over the next 90 days, patient will verbalize ability to afford treatment regimen maintain control of t2dm as evidenced by improved glycemic control  through collaboration with PharmD and provider.    Interventions: 1:1 collaboration with Loman Brooklyn, FNP regarding development and update of comprehensive plan of care as evidenced by provider attestation and co-signature Inter-disciplinary care team collaboration (see longitudinal plan of care) Comprehensive medication review performed; medication list updated in electronic medical record  Diabetes: Uncontrolled-recent increase in A1c  TO 7.5%; current treatment: metformin, glipizide, FARXIGA;  Patient cannot afford jardiance (tolerated samples well) CONTINUE  farxiga 10MG DAILY for ease of patient assistance (ASSISTANCE APPROVED via [place]&me, [place]) Tolerating well per husband (patient is non verbal) Current glucose readings: fasting glucose: <160-170, post prandial glucose: <200 Denies hypoglycemic/hyperglycemic symptoms Discussed meal planning options and Plate method for healthy eating Avoid sugary drinks and desserts Incorporate balanced protein, non starchy veggies, 1 serving of carbohydrate with each  meal Increase water intake Increase physical activity as able  Current exercise: N/A Educated on [place] patient finances. APPLICATION RE-SUBMITTED FOR FARXIGA ([place]) 2023    Patient Goals/Self-Care Activities Over the next 90 days, patient will:  - take medications as prescribed check glucose DAILY (FASTING), document, and provide at future appointments collaborate with provider on medication access solutions  Follow Up Plan: Telephone follow up appointment with care management team member scheduled for: 3 MONTH      Medication Assistance:  FARXIGA obtained through [place] medication assistance program.  Enrollment ends 09/18/21--REENROLLMENT SENT TODAY  Patient's preferred pharmacy is:  [facility] [id number] - [place], [place] [address] [place] [address] Phone: [phone] Fax: [place], [place] [place] [address] Phone: [phone] Fax: [phone]   Follow Up:  Patient agrees to Care Plan and Follow-up.  Plan: Telephone follow up appointment with care management team member scheduled for:  10/28/21   Regina Eck, PharmD, BCPS Clinical Pharmacist, [place]  II Phone [phone]

## 2021-09-13 ENCOUNTER — Other Ambulatory Visit: Payer: Self-pay | Admitting: Family Medicine

## 2021-09-21 ENCOUNTER — Other Ambulatory Visit: Payer: Self-pay | Admitting: Family Medicine

## 2021-09-29 ENCOUNTER — Encounter: Payer: Self-pay | Admitting: Family Medicine

## 2021-09-29 ENCOUNTER — Ambulatory Visit (INDEPENDENT_AMBULATORY_CARE_PROVIDER_SITE_OTHER): Payer: Medicare Other | Admitting: Family Medicine

## 2021-09-29 VITALS — BP 106/75 | HR 61 | Temp 96.8°F | Ht 61.0 in | Wt 122.2 lb

## 2021-09-29 DIAGNOSIS — E1165 Type 2 diabetes mellitus with hyperglycemia: Secondary | ICD-10-CM

## 2021-09-29 DIAGNOSIS — M8589 Other specified disorders of bone density and structure, multiple sites: Secondary | ICD-10-CM | POA: Diagnosis not present

## 2021-09-29 DIAGNOSIS — E782 Mixed hyperlipidemia: Secondary | ICD-10-CM | POA: Insufficient documentation

## 2021-09-29 DIAGNOSIS — I10 Essential (primary) hypertension: Secondary | ICD-10-CM | POA: Diagnosis not present

## 2021-09-29 DIAGNOSIS — E1169 Type 2 diabetes mellitus with other specified complication: Secondary | ICD-10-CM | POA: Insufficient documentation

## 2021-09-29 LAB — CBC WITH DIFFERENTIAL/PLATELET
Basophils Absolute: 0.1 10*3/uL (ref 0.0–0.2)
Basos: 1 %
EOS (ABSOLUTE): 0.6 10*3/uL — ABNORMAL HIGH (ref 0.0–0.4)
Eos: 12 %
Hematocrit: 40.6 % (ref 34.0–46.6)
Hemoglobin: 13.2 g/dL (ref 11.1–15.9)
Immature Grans (Abs): 0 10*3/uL (ref 0.0–0.1)
Immature Granulocytes: 0 %
Lymphocytes Absolute: 1.3 10*3/uL (ref 0.7–3.1)
Lymphs: 28 %
MCH: 28.1 pg (ref 26.6–33.0)
MCHC: 32.5 g/dL (ref 31.5–35.7)
MCV: 87 fL (ref 79–97)
Monocytes Absolute: 0.4 10*3/uL (ref 0.1–0.9)
Monocytes: 8 %
Neutrophils Absolute: 2.4 10*3/uL (ref 1.4–7.0)
Neutrophils: 51 %
Platelets: 177 10*3/uL (ref 150–450)
RBC: 4.69 x10E6/uL (ref 3.77–5.28)
RDW: 12.6 % (ref 11.7–15.4)
WBC: 4.8 10*3/uL (ref 3.4–10.8)

## 2021-09-29 LAB — CMP14+EGFR
ALT: 24 IU/L (ref 0–32)
AST: 17 IU/L (ref 0–40)
Albumin/Globulin Ratio: 2.6 — ABNORMAL HIGH (ref 1.2–2.2)
Albumin: 4.6 g/dL (ref 3.8–4.8)
Alkaline Phosphatase: 41 IU/L — ABNORMAL LOW (ref 44–121)
BUN/Creatinine Ratio: 16 (ref 12–28)
BUN: 14 mg/dL (ref 8–27)
Bilirubin Total: 0.5 mg/dL (ref 0.0–1.2)
CO2: 19 mmol/L — ABNORMAL LOW (ref 20–29)
Calcium: 9.4 mg/dL (ref 8.7–10.3)
Chloride: 107 mmol/L — ABNORMAL HIGH (ref 96–106)
Creatinine, Ser: 0.89 mg/dL (ref 0.57–1.00)
Globulin, Total: 1.8 g/dL (ref 1.5–4.5)
Glucose: 178 mg/dL — ABNORMAL HIGH (ref 70–99)
Potassium: 4 mmol/L (ref 3.5–5.2)
Sodium: 140 mmol/L (ref 134–144)
Total Protein: 6.4 g/dL (ref 6.0–8.5)
eGFR: 71 mL/min/{1.73_m2} (ref >59)

## 2021-09-29 LAB — LIPID PANEL
Chol/HDL Ratio: 2.5 ratio (ref 0.0–4.4)
Cholesterol, Total: 71 mg/dL — ABNORMAL LOW (ref 100–199)
HDL: 28 mg/dL — ABNORMAL LOW (ref >39)
LDL Chol Calc (NIH): 18 mg/dL (ref 0–99)
Triglycerides: 149 mg/dL (ref 0–149)
VLDL Cholesterol Cal: 25 mg/dL (ref 5–40)

## 2021-09-29 LAB — BAYER DCA HB A1C WAIVED: HB A1C (BAYER DCA - WAIVED): 6.2 % — ABNORMAL HIGH (ref 4.8–5.6)

## 2021-09-29 MED ORDER — ATENOLOL 50 MG PO TABS: 50.0000 mg | ORAL_TABLET | Freq: Every day | ORAL | 1 refills | Status: DC

## 2021-09-29 MED ORDER — GLIPIZIDE 10 MG PO TABS: 10.0000 mg | ORAL_TABLET | Freq: Two times a day (BID) | ORAL | 1 refills | Status: DC

## 2021-09-29 MED ORDER — ATORVASTATIN CALCIUM 20 MG PO TABS: 20.0000 mg | ORAL_TABLET | Freq: Every day | ORAL | 1 refills | Status: DC

## 2021-09-29 NOTE — Patient Instructions (Addendum)
Please schedule diabetic eye exam with the eye doctor at [facility]. The last appointment was in November 2021.  Please take over the counter Calcium 1,200 mg and Vitamin  D3 800 IU once daily to protect the bones. Doing weightbearing exercise like walking, jogging, dancing, etc. is good for bones.

## 2021-09-29 NOTE — Progress Notes (Signed)
Assessment & Plan:  1. Controlled type 2 diabetes mellitus with hyperglycemia, without long-term current use of insulin (HCC) A1c 6.2 today which is improved from 7.5. - Diabetes is at goal of A1c < 7. - Medications: continue current medications - Home glucose monitoring: yes, continue to monitor at home - Patient is currently taking a statin. Patient is taking an ACE-inhibitor/ARB.  - Instruction/counseling given: reminded to get eye exam and discussed diet  Diabetes Health Maintenance Due  Topic Date Due   OPHTHALMOLOGY EXAM  07/28/2021   HEMOGLOBIN A1C  10/28/2021   FOOT EXAM  04/27/2022    Lab Results  Component Value Date   LABMICR 4.3 10/28/2020   - Lipid panel - CBC with Differential/Platelet - CMP14+EGFR - Bayer DCA Hb A1c Waived - atorvastatin (LIPITOR) 20 MG tablet; Take 1 tablet (20 mg total) by mouth daily.  Dispense: 90 tablet; Refill: 1 - glipiZIDE (GLUCOTROL) 10 MG tablet; Take 1 tablet (10 mg total) by mouth 2 (two) times daily.  Dispense: 180 tablet; Refill: 1 - Microalbumin / creatinine urine ratio  2. Essential hypertension Well controlled on current regimen - Lipid panel - CBC with Differential/Platelet - CMP14+EGFR - atenolol (TENORMIN) 50 MG tablet; Take 1 tablet (50 mg total) by mouth daily.  Dispense: 90 tablet; Refill: 1  3. Mixed hyperlipidemia Well controlled on current regimen - Lipid panel - CMP14+EGFR - atorvastatin (LIPITOR) 20 MG tablet; Take 1 tablet (20 mg total) by mouth daily.  Dispense: 90 tablet; Refill: 1  4. Osteopenia of multiple sites - encouraged to take calcium and vitamin D supplements - education provided on osteopenia   Follow-up: Return in about 6 months (around 03/29/2022) for follow-up of chronic medication conditions.   Lucile Crater, NP Student  I personally was present during the history, physical exam, and medical decision-making activities of this service and have verified that the service and findings are  accurately documented in the nurse practitioner student's note.  Hendricks Limes, MSN, APRN, FNP-C [facility]  Subjective:  Patient ID: Barbara Schroeder, Barbara Schroeder    DOB: 1951-09-04  Age: 71 y.o. MRN: [id number]  Patient Care Team: Loman Brooklyn, FNP as PCP - General (Family Medicine) Lavera Guise, [facility] as Pharmacist (Family Medicine)  CC:  Chief Complaint  Patient presents with   Diabetes    3 month follow up     HPI Barbara Schroeder presents for a follow-up of chronic medical conditions.  Patient is accompanied by her husband who is her caregiver and provides all information as patient is nonverbal.  Diabetes: Patient presents for follow up of diabetes. Current symptoms include: hyperglycemia. Known diabetic complications: none. Medication compliance: yes, metformin  1,000 mg BID, Farxiga 10 mg daily, and glipizide 10 mg BID. Current diet:  no sweets; high carbs . Husband states she is eating healthy lately. Current exercise: none. Home blood sugar records:  90s-140s fasting . Is she  on ACE inhibitor or angiotensin II receptor blocker? Yes (Lisinopril). Is she on a statin? Yes (Atorvastatin). She is due for an eye exam, husband states she will get this at [facility].   Hyperlipidemia: controlled with atorvastatin.  Hypertension: controlled with lisinopril-HCTZ and atenolol daily.   Osteopenia: diagnosed after last DEXA scan, advised to take a calcium and vitamin D supplement. Her husband states that she has not been taking these because he forgot to pick them up for her.   Review of Systems  Constitutional:  Negative for chills, fever, malaise/fatigue and  weight loss.  HENT:  Negative for congestion, ear discharge, ear pain, nosebleeds, sinus pain, sore throat and tinnitus.   Eyes:  Negative for blurred vision, double vision, pain, discharge and redness.  Respiratory:  Negative for cough, shortness of breath and wheezing.   Cardiovascular:  Negative for  chest pain, palpitations and leg swelling.  Gastrointestinal:  Negative for abdominal pain, constipation, diarrhea, heartburn, nausea and vomiting.  Genitourinary:  Negative for dysuria, frequency and urgency.  Musculoskeletal:  Negative for myalgias.  Skin:  Negative for rash.  Neurological:  Negative for dizziness, seizures, weakness and headaches.  Psychiatric/Behavioral:  Negative for depression, substance abuse and suicidal ideas. The patient is not nervous/anxious.    Current Outpatient Medications:    dapagliflozin propanediol (FARXIGA) 10 MG TABS tablet, Take 1 tablet (10 mg total) by mouth daily before breakfast., Disp: 90 tablet, Rfl: 3   lisinopril-hydrochlorothiazide (ZESTORETIC) 20-12.5 MG tablet, TAKE ONE TABLET BY MOUTH DAILY, Disp: 90 tablet, Rfl: 0   metFORMIN (GLUCOPHAGE-XR) 500 MG 24 hr tablet, TAKE TWO TABLETS BY MOUTH TWICE A DAY, Disp: 120 tablet, Rfl: 1   atenolol (TENORMIN) 50 MG tablet, Take 1 tablet (50 mg total) by mouth daily., Disp: 90 tablet, Rfl: 1   atorvastatin (LIPITOR) 20 MG tablet, Take 1 tablet (20 mg total) by mouth daily., Disp: 90 tablet, Rfl: 1   glipiZIDE (GLUCOTROL) 10 MG tablet, Take 1 tablet (10 mg total) by mouth 2 (two) times daily., Disp: 180 tablet, Rfl: 1  No Known Allergies  Past Medical History:  Diagnosis Date   Cataract    Diabetes mellitus without complication ([place])    Hypertension    Osteopenia 04/28/2021    Past Surgical History:  Procedure Laterality Date   EYE SURGERY      Family History  Problem Relation Age of Onset   Diabetes Mother    Throat cancer Brother     Social History   Socioeconomic History   Marital status: Married    Spouse name: Not on file   Number of children: Not on file   Years of education: Not on file   Highest education level: Not on file  Occupational History   Not on file  Tobacco Use   Smoking status: Former    Types: Cigarettes    Quit date: 1995    Years since quitting: 28.0    Smokeless tobacco: Never  Vaping Use   Vaping Use: Never used  Substance and Sexual Activity   Alcohol use: Not Currently   Drug use: Never   Sexual activity: Not on file  Other Topics Concern   Not on file  Social History Narrative   Not on file   Social Determinants of Health   Financial Resource Strain: Not on file  Food Insecurity: Not on file  Transportation Needs: Not on file  Physical Activity: Not on file  Stress: Not on file  Social Connections: Not on file  Intimate Partner Violence: Not on file    Objective:   Today's Vitals: BP 106/75    Pulse 61    Temp (!) 96.8 F (36 C) (Temporal)    Ht _0  (1.549 m)    Wt 55.4 kg    SpO2 99%    BMI 23.09 kg/m   Physical Exam Vitals reviewed.  Constitutional:      General: She is not in acute distress.    Appearance: Normal appearance. She is normal weight. She is not ill-appearing, toxic-appearing or diaphoretic.  HENT:  Head: Normocephalic and atraumatic.  Eyes:     General: No scleral icterus.       Right eye: No discharge.        Left eye: No discharge.     Conjunctiva/sclera: Conjunctivae normal.  Cardiovascular:     Rate and Rhythm: Normal rate and regular rhythm.     Heart sounds: Normal heart sounds. No murmur heard.   No friction rub. No gallop.  Pulmonary:     Effort: Pulmonary effort is normal. No respiratory distress.     Breath sounds: Normal breath sounds. No stridor. No wheezing, rhonchi or rales.  Musculoskeletal:        General: Normal range of motion.     Cervical back: Normal range of motion.  Skin:    General: Skin is warm and dry.     Capillary Refill: Capillary refill takes less than 2 seconds.  Neurological:     General: No focal deficit present.     Mental Status: She is alert and oriented to person, place, and time. Mental status is at baseline.  Psychiatric:        Attention and Perception: Attention normal.        Mood and Affect: Mood normal.        Speech: She is  noncommunicative.        Behavior: Behavior normal.

## 2021-09-30 LAB — MICROALBUMIN / CREATININE URINE RATIO
Creatinine, Urine: 67.7 mg/dL
Microalb/Creat Ratio: 44 mg/g creat — ABNORMAL HIGH (ref 0–29)
Microalbumin, Urine: 30 ug/mL

## 2021-10-01 ENCOUNTER — Telehealth: Payer: Self-pay | Admitting: Pharmacist

## 2021-10-01 NOTE — Telephone Encounter (Signed)
Pt made aware

## 2021-10-01 NOTE — Telephone Encounter (Signed)
Please let patient know Farxiga samples left up front for patient Farxiga 10mg  daily #30

## 2021-10-13 NOTE — Progress Notes (Signed)
Received notification from [place] regarding approval for FARXIGA. Patient assistance approved until 09/18/22. ° °Phone: [phone] ° °

## 2021-10-18 ENCOUNTER — Telehealth: Payer: Self-pay | Admitting: Family Medicine

## 2021-10-20 NOTE — Telephone Encounter (Signed)
Patient was approved for 2023 but hasn't received shipment Gave samples so not urgent, but can we follow up on this?

## 2021-10-21 NOTE — Telephone Encounter (Signed)
Left pt voicemail regarding shipping info of medication. Medication is in shipment processing center as of 10/14/21 and will ship soon. Pt can follow up on her shipment with [employer] at [phone].

## 2021-10-28 ENCOUNTER — Ambulatory Visit (INDEPENDENT_AMBULATORY_CARE_PROVIDER_SITE_OTHER): Payer: Medicare Other | Admitting: Pharmacist

## 2021-10-28 DIAGNOSIS — I10 Essential (primary) hypertension: Secondary | ICD-10-CM

## 2021-10-28 DIAGNOSIS — E1165 Type 2 diabetes mellitus with hyperglycemia: Secondary | ICD-10-CM

## 2021-10-28 NOTE — Patient Instructions (Signed)
Visit Information  Following are the goals we discussed today:  Diabetes: Controlled-A1C now at goal; 6.2%, GFR 71 current treatment: metformin, glipizide, FARXIGA 10mg ;  Patient could not afford jardiance (tolerated samples well) CONTINUE  farxiga 10MG  DAILY for ease of patient assistance (ASSISTANCE APPROVED via [place], PATIENT RECEIVED SHIPMENT-confirmed) Tolerating well per husband (patient is non verbal) Plan to d/c glipizide in the next 1-3 months pending blood sugars; will have patient decrease to glipizide 10mg  once daily to see how her sugars respond Current glucose readings: fasting glucose: <160-170, post prandial glucose: <200 Denies hypoglycemic/hyperglycemic symptoms Discussed meal planning options and Plate method for healthy eating Avoid sugary drinks and desserts Incorporate balanced protein, non starchy veggies, 1 serving of carbohydrate with each meal Increase water intake Increase physical activity as able  Current exercise: N/A Educated on FARXIGA; DECREASED GLIPIZIDE Assessed patient finances. APPLICATION APPROVED FOR [place] ([place]) 2023   Plan: Telephone follow up appointment with care management team member scheduled for:  3 MONTHS  Signature Regina Eck, PharmD, BCPS Clinical Pharmacist, [place]  II Phone [phone]   Please call the care guide team at [phone] if you need to cancel or reschedule your appointment.   The patient verbalized understanding of instructions, educational materials, and care plan provided today and agreed to receive a mailed copy of patient instructions, educational materials, and care plan.

## 2021-10-28 NOTE — Progress Notes (Signed)
° ° °Chronic Care Management °Pharmacy Note ° °10/28/2021 °Name:  Barbara Schroeder MRN:  [id number] DOB:  09/26/1950 ° °Summary: T2DM ° °Recommendations/Changes made from today's visit: °Diabetes: °Controlled-A1C now at goal; 6.2%, GFR 71 current treatment: metformin, glipizide, FARXIGA 10mg;  °Patient could not afford jardiance (tolerated samples well) °CONTINUE  farxiga 10MG DAILY for ease of patient assistance (ASSISTANCE APPROVED via [place], PATIENT RECEIVED SHIPMENT-confirmed) °Tolerating well per husband (patient is non verbal) °Plan to d/c glipizide in the next 1-3 months pending blood sugars; will have patient decrease to glipizide 10mg once daily to see how her sugars respond °Current glucose readings: fasting glucose: <160-170, post prandial glucose: <200 ° °Subjective: °Barbara Schroeder is an 71 y.o. year old female who is a primary patient of Joyce, Britney F, FNP.  The CCM team was consulted for assistance with disease management and care coordination needs.   ° °Collaboration with HUSBAND/PATIENT--SHE IS NON-VERBAL  for follow up visit in response to provider referral for pharmacy case management and/or care coordination services.  ° °Consent to Services:  °The patient was given information about Chronic Care Management services, agreed to services, and gave verbal consent prior to initiation of services.  Please see initial visit note for detailed documentation.  ° °Patient Care Team: °Joyce, Britney F, FNP as PCP - General (Family Medicine) °Pruitt, Julie D, RPH as Pharmacist (Family Medicine) ° °Objective: ° °Lab Results  °Component Value Date  ° CREATININE 0.89 09/29/2021  ° CREATININE 0.81 04/27/2021  ° CREATININE 0.71 10/28/2020  ° ° °Lab Results  °Component Value Date  ° HGBA1C 6.2 (H) 09/29/2021  ° °Last diabetic Eye exam:  °Lab Results  °Component Value Date/Time  ° HMDIABEYEEXA Retinopathy (A) 07/28/2020 12:00 AM  °  °Last diabetic Foot exam: No results found for: HMDIABFOOTEX  ° °   °Component  Value Date/Time  ° CHOL 71 (L) 09/29/2021 0922  ° TRIG 149 09/29/2021 0922  ° HDL 28 (L) 09/29/2021 0922  ° CHOLHDL 2.5 09/29/2021 0922  ° LDLCALC 18 09/29/2021 0922  ° ° °Hepatic Function Latest Ref Rng & Units 09/29/2021 04/27/2021 10/28/2020  °Total Protein 6.0 - 8.5 g/dL 6.4 6.3 6.4  °Albumin 3.8 - 4.8 g/dL 4.6 4.1 4.4  °AST 0 - 40 IU/L 17 20 25  °ALT 0 - 32 IU/L 24 31 32  °Alk Phosphatase 44 - 121 IU/L 41(L) 53 61  °Total Bilirubin 0.0 - 1.2 mg/dL 0.5 0.6 0.4  ° ° °No results found for: TSH, FREET4 ° °CBC Latest Ref Rng & Units 09/29/2021 04/27/2021 10/28/2020  °WBC 3.4 - 10.8 x10E3/uL 4.8 5.2 7.1  °Hemoglobin 11.1 - 15.9 g/dL 13.2 13.5 13.1  °Hematocrit 34.0 - 46.6 % 40.6 40.3 39.3  °Platelets 150 - 450 x10E3/uL 177 159 195  ° ° °No results found for: VD25OH ° °Clinical ASCVD: No  °The ASCVD Risk score (Arnett DK, et al., 2019) failed to calculate for the following reasons: °  The valid total cholesterol range is 130 to 320 mg/dL   ° °Other: (CHADS2VASc if Afib, PHQ9 if depression, MMRC or CAT for COPD, ACT, DEXA) ° °Social History  ° °Tobacco Use  °Smoking Status Former  ° Types: Cigarettes  ° Quit date: 1995  ° Years since quitting: 28.1  °Smokeless Tobacco Never  ° °BP Readings from Last 3 Encounters:  °09/29/21 106/75  °04/27/21 115/71  °10/28/20 106/68  ° °Pulse Readings from Last 3 Encounters:  °09/29/21 61  °04/27/21 71  °10/28/20 63  ° °Wt Readings from   from Last 3 Encounters:  09/29/21 122 lb 3.2 oz (55.4 kg)  04/27/21 132 lb (59.9 kg)  10/28/20 125 lb 6.4 oz (56.9 kg)    Assessment: Review of patient past medical history, allergies, medications, health status, including review of consultants reports, laboratory and other test data, was performed as part of comprehensive evaluation and provision of chronic care management services.   SDOH:  (Social Determinants of Health) assessments and interventions performed:    CCM Care Plan  No Known Allergies  Medications Reviewed Today     Reviewed by Lavera Guise, [facility] (Pharmacist) on 10/28/21 at 0847  Med List Status: <None>   Medication Order Taking? Sig Documenting Provider Last Dose Status Informant  atenolol (TENORMIN) 50 MG tablet [id number]  Take 1 tablet (50 mg total) by mouth daily. Loman Brooklyn, FNP  Active   atorvastatin (LIPITOR) 20 MG tablet [id number]  Take 1 tablet (20 mg total) by mouth daily. Loman Brooklyn, FNP  Active   dapagliflozin propanediol (FARXIGA) 10 MG TABS tablet [id number] No Take 1 tablet (10 mg total) by mouth daily before breakfast. Loman Brooklyn, FNP Taking Active            Med Note Blanca Friend, Sherian Maroon Oct 28, 2021  8:47 AM) Via [place] patient assistance program   glipiZIDE (GLUCOTROL) 10 MG tablet [id number]  Take 1 tablet (10 mg total) by mouth 2 (two) times daily. Hendricks Limes F, FNP  Active   lisinopril-hydrochlorothiazide (ZESTORETIC) 20-12.5 MG tablet [id number] No TAKE ONE TABLET BY MOUTH DAILY Loman Brooklyn, FNP Taking Active   metFORMIN (GLUCOPHAGE-XR) 500 MG 24 hr tablet [id number] No TAKE TWO TABLETS BY MOUTH TWICE A DAY Loman Brooklyn, FNP Taking Active             Patient Active Problem List   Diagnosis Date Noted   Mixed hyperlipidemia 09/29/2021   Osteopenia 04/28/2021   Controlled type 2 diabetes mellitus with hyperglycemia, without long-term current use of insulin ([place]) 10/28/2020   Essential hypertension 10/28/2020   Nonverbal 10/28/2020   Cognitive impairment 10/11/2018    Immunization History  Administered Date(s) Administered   Influenza, Seasonal, Injecte, Preservative Fre 08/01/2016   Influenza,inj,Quad PF,6+ Mos 08/08/2018, 05/21/2019   Influenza-Unspecified 07/30/2020   Moderna Covid-19 Vaccine Bivalent Booster 64yr & up 06/25/2021   PFIZER(Purple Top)SARS-COV-2 Vaccination 10/25/2019, 11/15/2019, 07/22/2020   Pneumococcal Conjugate-13 02/06/2019   Pneumococcal Polysaccharide-23 01/09/2018   Tdap 12/02/2019    Conditions to be addressed/monitored: HTN and  DMII  Care Plan : PHARMD MEDICATION MANAGEMENT  Updates made by PLavera Guise [facility] 10/28/2021 12:00 AM     Problem: DISEASE PROGRESSION PREVENTION      Long-Range Goal: T2DM   This Visit's Progress: On track  Recent Progress: Not on track  Priority: High  Note:   Current Barriers:  Unable to independently afford treatment regimen Unable to maintain control of T2DM Suboptimal therapeutic regimen for T2DM Patient is non-verbal; husband communicates on her behalf  Pharmacist Clinical Goal(s):  Over the next 90 days, patient will verbalize ability to afford treatment regimen maintain control of t2dm as evidenced by improved glycemic control  through collaboration with PharmD and provider.    Interventions: 1:1 collaboration with JLoman Brooklyn FNP regarding development and update of comprehensive plan of care as evidenced by provider attestation and co-signature Inter-disciplinary care team collaboration (see longitudinal plan of care) Comprehensive medication review performed; medication list updated in electronic medical record  Controlled-A1C now at goal; 6.2%, GFR 71 current treatment: metformin, glipizide, FARXIGA 10mg;  °Patient could not afford jardiance (tolerated samples well) °CONTINUE  farxiga 10MG DAILY for ease of patient assistance (ASSISTANCE APPROVED via AZ&me, PATIENT RECEIVED SHIPMENT-confirmed) °Tolerating well per husband (patient is non verbal) °Plan to d/c glipizide in the next 1-3 months pending blood sugars; will have patient decrease to glipizide 10mg once daily to see how her sugars respond °Current glucose readings: fasting glucose: <160-170, post prandial glucose: <200 °Denies hypoglycemic/hyperglycemic symptoms °Discussed meal planning options and Plate method for healthy eating °Avoid sugary drinks and desserts °Incorporate balanced protein, non starchy veggies, 1 serving of carbohydrate with each meal °Increase water intake °Increase  physical activity as able  °Current exercise: N/A °Educated on FARXIGA; DECREASED GLIPIZIDE °Assessed patient finances. APPLICATION APPROVED FOR FARXIGA ([place]) 2023  ° ° °Patient Goals/Self-Care Activities °Over the next 90 days, patient will:  °- take medications as prescribed °check glucose DAILY (FASTING), document, and provide at future appointments °collaborate with provider on medication access solutions ° °Follow Up Plan: Telephone follow up appointment with care management team member scheduled for: 3 MONTHS ° °  ° ° °Medication Assistance:  FARXIGA obtained through [place] medication assistance program.  Enrollment ends 09/18/22 ° °Patient's preferred pharmacy is: ° °[id number] - [place], [place] - [address] °[address] °[place] [address] °Phone: [phone] Fax: [phone] ° °[facility] - [place], [place] - [address] °[address] °[place] [address] °Phone: [phone] Fax: [phone] ° °Uses pill box? No - HUSBAND ASSISTS WITH ALL MEDICATIONS °Pt endorses 100% compliance ° °Follow Up:  Patient agrees to Care Plan and Follow-up. ° °Plan: Telephone follow up appointment with care management team member scheduled for:  3 MONTHS ° ° °Julie Dattero Pruitt, PharmD, BCPS °Clinical Pharmacist, [facility] °[place]  II Phone [phone] ° ° ° ° °

## 2021-11-16 DIAGNOSIS — I10 Essential (primary) hypertension: Secondary | ICD-10-CM

## 2021-11-16 DIAGNOSIS — E1165 Type 2 diabetes mellitus with hyperglycemia: Secondary | ICD-10-CM

## 2021-11-21 ENCOUNTER — Other Ambulatory Visit: Payer: Self-pay | Admitting: Family Medicine

## 2021-12-17 ENCOUNTER — Other Ambulatory Visit: Payer: Self-pay | Admitting: Family Medicine

## 2021-12-28 ENCOUNTER — Telehealth: Payer: Medicare Other

## 2022-01-07 ENCOUNTER — Ambulatory Visit (INDEPENDENT_AMBULATORY_CARE_PROVIDER_SITE_OTHER): Payer: Medicare Other | Admitting: Pharmacist

## 2022-01-07 DIAGNOSIS — E1165 Type 2 diabetes mellitus with hyperglycemia: Secondary | ICD-10-CM

## 2022-01-07 DIAGNOSIS — I10 Essential (primary) hypertension: Secondary | ICD-10-CM

## 2022-01-12 NOTE — Progress Notes (Signed)
? ? ?Chronic Care Management ?Pharmacy Note ? ?01/07/2022 ?Name:  Barbara Schroeder MRN:  [id number] DOB:  [birth year] ? ?Summary: ? ?Diabetes: ?Controlled-A1C now at goal; 6.2%, GFR 71 current treatment: metformin, glipizide (once daily), FARXIGA 51m;  ?Patient could not afford jardiance (tolerated samples well) ?CONTINUE METFORMIN & GLIPIZIDE (once daily) FOR NOW ?CONTINUE  farxiga 10MG DAILY (ASSISTANCE APPROVED via AZ&me, PATIENT RECEIVED SHIPMENT-confirmed) ?Tolerating well per husband (patient is non verbal) ?Plan to d/c glipizide in the next 1-3 months pending blood sugars ?Current glucose readings: fasting glucose: <160-170, post prandial glucose: <200 ?Denies hypoglycemic/hyperglycemic symptoms ?Discussed meal planning options and Plate method for healthy eating ?Avoid sugary drinks and desserts ?Incorporate balanced protein, non starchy veggies, 1 serving of carbohydrate with each meal ?Increase water intake ?Increase physical activity as able  ?Current exercise: N/A ?Educated on [place] OTHER DM MEDS ?Assessed patient finances. [place]  ? ? ?Subjective: ?RAngelicia Lessneris an 71y.o. year old female who is a primary patient of JLoman Brooklyn FNP.  The CCM team was consulted for assistance with disease management and care coordination needs.   ? ?Engaged with patient by telephone for follow up visit in response to provider referral for pharmacy case management and/or care coordination services.  ? ?Consent to Services:  ?The patient was given information about Chronic Care Management services, agreed to services, and gave verbal consent prior to initiation of services.  Please see initial visit note for detailed documentation.  ? ?Patient Care Team: ?JLoman Brooklyn FNP as PCP - General (Family Medicine) ?PLavera Guise [facility] Pharmacist (Family Medicine) ? ?Objective: ? ?Lab Results  ?Component Value Date  ? CREATININE 0.89 09/29/2021  ? CREATININE 0.81  04/27/2021  ? CREATININE 0.71 10/28/2020  ? ? ?Lab Results  ?Component Value Date  ? HGBA1C 6.2 (H) 09/29/2021  ? ?Last diabetic Eye exam:  ?Lab Results  ?Component Value Date/Time  ? HMDIABEYEEXA Retinopathy (A) 07/28/2020 12:00 AM  ?  ?Last diabetic Foot exam: No results found for: HMDIABFOOTEX  ? ?   ?Component Value Date/Time  ? CHOL 71 (L) 09/29/2021 02440 ? TRIG 149 09/29/2021 0922  ? HDL 28 (L) 09/29/2021 01027 ? CHOLHDL 2.5 09/29/2021 0922  ? [place]18 09/29/2021 0922  ? ? ? ?  Latest Ref Rng & Units 09/29/2021  ?  9:22 AM 04/27/2021  ?  1:39 PM 10/28/2020  ?  1:50 PM  ?Hepatic Function  ?Total Protein 6.0 - 8.5 g/dL 6.4   6.3   6.4    ?Albumin 3.8 - 4.8 g/dL 4.6   4.1   4.4    ?AST 0 - 40 IU/L _0 ?ALT 0 - 32 IU/L 24   31   32    ?Alk Phosphatase 44 - 121 IU/L 41   53   61    ?Total Bilirubin 0.0 - 1.2 mg/dL 0.5   0.6   0.4    ? ? ?No results found for: TSH, FREET4 ? ? ?  Latest Ref Rng & Units 09/29/2021  ?  9:22 AM 04/27/2021  ?  1:39 PM 10/28/2020  ?  1:50 PM  ?CBC  ?WBC 3.4 - 10.8 x10E3/uL 4.8   5.2   7.1    ?Hemoglobin 11.1 - 15.9 g/dL 13.2   13.5   13.1    ?Hematocrit 34.0 - 46.6 % 40.6   40.3   39.3    ?  Platelets 150 - 450 x10E3/uL 177   159   195    ? ? ?No results found for: VD25OH ? ?Clinical ASCVD: No  ?The ASCVD Risk score (Arnett DK, et al., 2019) failed to calculate for the following reasons: ?  The valid total cholesterol range is 130 to 320 mg/dL   ? ?Other: (CHADS2VASc if Afib, PHQ9 if depression, MMRC or CAT for COPD, ACT, DEXA) ? ?Social History  ? ?Tobacco Use  ?Smoking Status Former  ? Types: Cigarettes  ? Quit date: 1995  ? Years since quitting: 28.3  ?Smokeless Tobacco Never  ? ?BP Readings from Last 3 Encounters:  ?09/29/21 106/75  ?04/27/21 115/71  ?10/28/20 106/68  ? ?Pulse Readings from Last 3 Encounters:  ?09/29/21 61  ?04/27/21 71  ?10/28/20 63  ? ?Wt Readings from Last 3 Encounters:  ?09/29/21 122 lb 3.2 oz (55.4 kg)  ?04/27/21 132 lb (59.9 kg)  ?10/28/20 125 lb 6.4 oz (56.9  kg)  ? ? ?Assessment: Review of patient past medical history, allergies, medications, health status, including review of consultants reports, laboratory and other test data, was performed as part of comprehensive evaluation and provision of chronic care management services.  ? ?SDOH:  (Social Determinants of Health) assessments and interventions performed:  ? ? ?CCM Care Plan ? ?No Known Allergies ? ?Medications Reviewed Today   ? ? Reviewed by Lavera Guise, [facility] (Pharmacist) on 01/12/22 at [place] List Status: <None>  ? ?Medication Order Taking? Sig Documenting Provider Last Dose Status Informant  ?atenolol (TENORMIN) 50 MG tablet [id number]  Take 1 tablet (50 mg total) by mouth daily. Loman Brooklyn, FNP  Active   ?atorvastatin (LIPITOR) 20 MG tablet [id number]  Take 1 tablet (20 mg total) by mouth daily. Loman Brooklyn, FNP  Active   ?dapagliflozin propanediol (FARXIGA) 10 MG TABS tablet [id number] No Take 1 tablet (10 mg total) by mouth daily before breakfast. Loman Brooklyn, FNP Taking Active   ?         ?Med Note Lavera Guise   Thu Oct 28, 2021  8:47 AM) Via AZ&me patient assistance program ?  ?glipiZIDE (GLUCOTROL) 10 MG tablet [id number]  Take 1 tablet (10 mg total) by mouth 2 (two) times daily. Hendricks Limes F, FNP  Active   ?lisinopril-hydrochlorothiazide (ZESTORETIC) 20-12.5 MG tablet [id number]  TAKE ONE TABLET BY MOUTH DAILY Loman Brooklyn, FNP  Active   ?metFORMIN (GLUCOPHAGE-XR) 500 MG 24 hr tablet [id number]  TAKE TWO TABLETS BY MOUTH TWICE A DAY Loman Brooklyn, FNP  Active   ? ?  ?  ? ?  ? ? ?Patient Active Problem List  ? Diagnosis Date Noted  ? Mixed hyperlipidemia 09/29/2021  ? Osteopenia 04/28/2021  ? Controlled type 2 diabetes mellitus with hyperglycemia, without long-term current use of insulin ([place]) 10/28/2020  ? Essential hypertension 10/28/2020  ? Nonverbal 10/28/2020  ? Cognitive impairment 10/11/2018  ? ? ?Immunization History  ?Administered Date(s) Administered  ? Influenza,  Seasonal, Injecte, Preservative Fre 08/01/2016  ? Influenza,inj,Quad PF,6+ Mos 08/08/2018, 05/21/2019  ? Influenza-Unspecified 07/30/2020  ? Moderna Covid-19 Vaccine Bivalent Booster 51yrs & up 06/25/2021  ? PFIZER(Purple Top)SARS-COV-2 Vaccination 10/25/2019, 11/15/2019, 07/22/2020  ? Pneumococcal Conjugate-13 02/06/2019  ? Pneumococcal Polysaccharide-23 01/09/2018  ? Tdap 12/02/2019  ? ? ?Conditions to be addressed/monitored: ?HTN and DMII ? ?Care Plan : PHARMD MEDICATION MANAGEMENT  ?Updates made by Lavera Guise, RPH since 01/12/2022 12:00 AM  ?  ? ?Problem: DISEASE PROGRESSION  PREVENTION   ?  ? ?Long-Range Goal: T2DM   ?Recent Progress: On track  ?Priority: High  ?Note:   ?Current Barriers:  ?Unable to independently afford treatment regimen ?Unable to maintain control of T2DM ?Suboptimal therapeutic regimen for T2DM ?Patient is non-verbal; husband communicates on her behalf ? ?Pharmacist Clinical Goal(s):  ?Over the next 90 days, patient will verbalize ability to afford treatment regimen ?maintain control of t2dm as evidenced by improved glycemic control  through collaboration with PharmD and provider.  ? ? ?Interventions: ?1:1 collaboration with Loman Brooklyn, FNP regarding development and update of comprehensive plan of care as evidenced by provider attestation and co-signature ?Inter-disciplinary care team collaboration (see longitudinal plan of care) ?Comprehensive medication review performed; medication list updated in electronic medical record ? ?Diabetes: ?Controlled-A1C now at goal; 6.2%, GFR 71 current treatment: metformin, glipizide, FARXIGA 57m;  ?Patient could not afford jardiance (tolerated samples well) ?CONTINUE METFORMIN & GLIPIZIDE FOR NOW ?CONTINUE  farxiga 10MG DAILY (ASSISTANCE APPROVED via [place], PATIENT RECEIVED SHIPMENT-confirmed) ?Tolerating well per husband (patient is non verbal) ?Plan to d/c glipizide in the next 1-3 months pending blood sugars ?Current glucose readings: fasting  glucose: <160-170, post prandial glucose: <200 ?Denies hypoglycemic/hyperglycemic symptoms ?Discussed meal planning options and Plate method for healthy eating ?Avoid sugary drinks and desserts ?Incorporate

## 2022-01-12 NOTE — Patient Instructions (Signed)
Visit Information ? ?Following are the goals we discussed today:  ?Current Barriers:  ?Unable to independently afford treatment regimen ?Unable to maintain control of T2DM ?Suboptimal therapeutic regimen for T2DM ?Patient is non-verbal; husband communicates on her behalf ? ?Pharmacist Clinical Goal(s):  ?Over the next 90 days, patient will verbalize ability to afford treatment regimen ?maintain control of t2dm as evidenced by improved glycemic control  through collaboration with PharmD and provider.  ? ? ?Interventions: ?1:1 collaboration with Gwenlyn Fudge, FNP regarding development and update of comprehensive plan of care as evidenced by provider attestation and co-signature ?Inter-disciplinary care team collaboration (see longitudinal plan of care) ?Comprehensive medication review performed; medication list updated in electronic medical record ? ?Diabetes: ?Controlled-A1C now at goal; 6.2%, GFR 71 current treatment: metformin, glipizide, FARXIGA 10mg ;  ?Patient could not afford jardiance (tolerated samples well) ?CONTINUE METFORMIN & GLIPIZIDE FOR NOW ?CONTINUE  farxiga 10MG  DAILY (ASSISTANCE APPROVED via [place], PATIENT RECEIVED SHIPMENT-confirmed) ?Tolerating well per husband (patient is non verbal) ?Plan to d/c glipizide in the next 1-3 months pending blood sugars ?Current glucose readings: fasting glucose: <160-170, post prandial glucose: <200 ?Denies hypoglycemic/hyperglycemic symptoms ?Discussed meal planning options and Plate method for healthy eating ?Avoid sugary drinks and desserts ?Incorporate balanced protein, non starchy veggies, 1 serving of carbohydrate with each meal ?Increase water intake ?Increase physical activity as able  ?Current exercise: N/A ?Educated on [place]; OTHER DM MEDS ?Assessed patient finances. APPLICATION APPROVED FOR FARXIGA ([place] 2023  ? ? ?Patient Goals/Self-Care Activities ?Over the next 90 days, patient will:  ?- take medications as prescribed ?check glucose DAILY  (FASTING), document, and provide at future appointments ?collaborate with provider on medication access solutions ? ?Follow Up Plan: Telephone follow up appointment with care management team member scheduled for: 3 MONTHS ? ? ?Plan: Telephone follow up appointment with care management team member scheduled for:  07/2022 ? ?Signature ?2024, PharmD, BCPS ?Clinical Pharmacist, [facility] ?[place]  II Phone [phone] ? ? ?Please call the care guide team at [phone] if you need to cancel or reschedule your appointment.  ? ?The patient verbalized understanding of instructions, educational materials, and care plan provided today and declined offer to receive copy of patient instructions, educational materials, and care plan.  ? ?

## 2022-01-16 DIAGNOSIS — E1165 Type 2 diabetes mellitus with hyperglycemia: Secondary | ICD-10-CM

## 2022-01-16 DIAGNOSIS — I10 Essential (primary) hypertension: Secondary | ICD-10-CM

## 2022-01-19 ENCOUNTER — Other Ambulatory Visit: Payer: Self-pay | Admitting: Family Medicine

## 2022-03-18 ENCOUNTER — Other Ambulatory Visit: Payer: Self-pay | Admitting: Family Medicine

## 2022-03-29 ENCOUNTER — Ambulatory Visit (INDEPENDENT_AMBULATORY_CARE_PROVIDER_SITE_OTHER): Payer: Medicare Other | Admitting: Family Medicine

## 2022-03-29 ENCOUNTER — Encounter: Payer: Self-pay | Admitting: Family Medicine

## 2022-03-29 VITALS — BP 97/64 | HR 67 | Temp 97.6°F | Ht 61.0 in | Wt 118.8 lb

## 2022-03-29 DIAGNOSIS — R6889 Other general symptoms and signs: Secondary | ICD-10-CM | POA: Diagnosis not present

## 2022-03-29 DIAGNOSIS — E1165 Type 2 diabetes mellitus with hyperglycemia: Secondary | ICD-10-CM | POA: Diagnosis not present

## 2022-03-29 DIAGNOSIS — E782 Mixed hyperlipidemia: Secondary | ICD-10-CM

## 2022-03-29 DIAGNOSIS — I10 Essential (primary) hypertension: Secondary | ICD-10-CM | POA: Diagnosis not present

## 2022-03-29 DIAGNOSIS — H6122 Impacted cerumen, left ear: Secondary | ICD-10-CM

## 2022-03-29 DIAGNOSIS — H60392 Other infective otitis externa, left ear: Secondary | ICD-10-CM

## 2022-03-29 LAB — BAYER DCA HB A1C WAIVED: HB A1C (BAYER DCA - WAIVED): 6.6 % — ABNORMAL HIGH (ref 4.8–5.6)

## 2022-03-29 MED ORDER — ATENOLOL 50 MG PO TABS: 50.0000 mg | ORAL_TABLET | Freq: Every day | ORAL | 1 refills | Status: DC

## 2022-03-29 MED ORDER — METFORMIN HCL ER 500 MG PO TB24: 1000.0000 mg | ORAL_TABLET | Freq: Two times a day (BID) | ORAL | 1 refills | Status: DC

## 2022-03-29 MED ORDER — CIPROFLOXACIN-DEXAMETHASONE 0.3-0.1 % OT SUSP: 4.0000 [drp] | Freq: Two times a day (BID) | OTIC | 0 refills | Status: AC

## 2022-03-29 MED ORDER — ATORVASTATIN CALCIUM 20 MG PO TABS: 20.0000 mg | ORAL_TABLET | Freq: Every day | ORAL | 1 refills | Status: DC

## 2022-03-29 MED ORDER — GLIPIZIDE 10 MG PO TABS: 10.0000 mg | ORAL_TABLET | Freq: Two times a day (BID) | ORAL | 1 refills | Status: DC

## 2022-03-29 MED ORDER — LEVOCETIRIZINE DIHYDROCHLORIDE 5 MG PO TABS
5.0000 mg | ORAL_TABLET | Freq: Every evening | ORAL | 2 refills | Status: DC
Start: 2022-03-29 — End: 2022-09-20

## 2022-03-29 NOTE — Progress Notes (Signed)
Assessment & Plan:  1. Controlled type 2 diabetes mellitus with hyperglycemia, without long-term current use of insulin (HCC) Lab Results  Component Value Date   HGBA1C 6.6 (H) 03/29/2022   HGBA1C 6.2 (H) 09/29/2021   HGBA1C 7.5 (H) 04/27/2021   - Diabetes is at goal of A1c < 7. - Medications: continue current medications - Home glucose monitoring: continue monitoring - Patient is currently taking a statin. Patient is taking an ACE-inhibitor/ARB.  - Instruction/counseling given: reminded to get eye exam  Diabetes Health Maintenance Due  Topic Date Due   OPHTHALMOLOGY EXAM  07/28/2021   HEMOGLOBIN A1C  03/29/2022   FOOT EXAM  04/27/2022    Lab Results  Component Value Date   LABMICR 30.0 09/29/2021   LABMICR 4.3 10/28/2020   - Lipid panel - CBC with Differential/Platelet - CMP14+EGFR - Bayer DCA Hb A1c Waived - glipiZIDE (GLUCOTROL) 10 MG tablet; Take 1 tablet (10 mg total) by mouth 2 (two) times daily.  Dispense: 180 tablet; Refill: 1 - atorvastatin (LIPITOR) 20 MG tablet; Take 1 tablet (20 mg total) by mouth daily.  Dispense: 90 tablet; Refill: 1 - metFORMIN (GLUCOPHAGE-XR) 500 MG 24 hr tablet; Take 2 tablets (1,000 mg total) by mouth 2 (two) times daily.  Dispense: 360 tablet; Refill: 1  2. Essential hypertension Well controlled on current regimen.  - Lipid panel - CBC with Differential/Platelet - CMP14+EGFR - atenolol (TENORMIN) 50 MG tablet; Take 1 tablet (50 mg total) by mouth daily.  Dispense: 90 tablet; Refill: 1  3. Mixed hyperlipidemia   - Lipid panel - CBC with Differential/Platelet - CMP14+EGFR - atorvastatin (LIPITOR) 20 MG tablet; Take 1 tablet (20 mg total) by mouth daily.  Dispense: 90 tablet; Refill: 1  4. Congestion of throat New start Xyzal. - levocetirizine (XYZAL) 5 MG tablet; Take 1 tablet (5 mg total) by mouth every evening.  Dispense: 30 tablet; Refill: 2  5. Impacted cerumen of left ear Successfully irrigated.  6. Acute infective  otitis externa of left ear - ciprofloxacin-dexamethasone (CIPRODEX) OTIC suspension; Place 4 drops into the left ear 2 (two) times daily for 7 days.  Dispense: 7.5 mL; Refill: 0   Follow-up: Return in about 6 months (around 09/29/2022) for follow-up of chronic medication conditions w. Jerilynn Mages. Rakes.   Hendricks Limes, MSN, APRN, FNP-C [facility]  Subjective:  Patient ID: Barbara Schroeder, female    DOB: 04-Oct-1950  Age: 71 y.o. MRN: [id number]  Patient Care Team: Loman Brooklyn, FNP as PCP - General (Family Medicine) Lavera Guise, [facility] as Pharmacist (Family Medicine)  CC:  Chief Complaint  Patient presents with   Medical Management of Chronic Issues    HPI Barbara Schroeder presents for a follow-up of chronic medical conditions.  Patient is accompanied by her husband who is her caregiver and provides all information as patient is nonverbal.  Diabetes: Patient presents for follow up of diabetes. Current symptoms include: hyperglycemia. Known diabetic complications: none. Medication compliance: yes, metformin  1,000 mg BID, Farxiga 10 mg daily, and glipizide 10 mg BID. Current diet:  no sweets; high carbs . Husband states she is eating healthy lately. Current exercise: none. Home blood sugar records: a little bit of highs but then came down to normal. Is she  on ACE inhibitor or angiotensin II receptor blocker? Yes (Lisinopril). Is she on a statin? Yes (Atorvastatin). She is due for an eye exam, husband states she will get this at [facility] - it is a big trip  for them so he hasn't scheduled it yet.   Hyperlipidemia: controlled with atorvastatin.  Hypertension: controlled with lisinopril-HCTZ and atenolol daily.   Osteopenia: diagnosed after last DEXA scan, advised to take a calcium and vitamin D supplement, which she takes "sometimes".    Patient's husband reports she has been rattling a little at night. Denies sneezing, runny nose, and itchy eyes. No acid reflux.     Review of Systems  Constitutional:  Negative for chills, fever, malaise/fatigue and weight loss.  HENT:  Negative for congestion, ear discharge, ear pain, nosebleeds, sinus pain, sore throat and tinnitus.   Eyes:  Negative for blurred vision, double vision, pain, discharge and redness.  Respiratory:  Negative for cough, shortness of breath and wheezing.   Cardiovascular:  Negative for chest pain, palpitations and leg swelling.  Gastrointestinal:  Negative for abdominal pain, constipation, diarrhea, heartburn, nausea and vomiting.  Genitourinary:  Negative for dysuria, frequency and urgency.  Musculoskeletal:  Negative for myalgias.  Skin:  Negative for rash.  Neurological:  Negative for dizziness, seizures, weakness and headaches.  Psychiatric/Behavioral:  Negative for depression, substance abuse and suicidal ideas. The patient is not nervous/anxious.    Current Outpatient Medications:    atenolol (TENORMIN) 50 MG tablet, Take 1 tablet (50 mg total) by mouth daily., Disp: 90 tablet, Rfl: 1   atorvastatin (LIPITOR) 20 MG tablet, Take 1 tablet (20 mg total) by mouth daily., Disp: 90 tablet, Rfl: 1   dapagliflozin propanediol (FARXIGA) 10 MG TABS tablet, Take 1 tablet (10 mg total) by mouth daily before breakfast., Disp: 90 tablet, Rfl: 3   glipiZIDE (GLUCOTROL) 10 MG tablet, Take 1 tablet (10 mg total) by mouth 2 (two) times daily. (Patient taking differently: Take 10 mg by mouth daily before breakfast.), Disp: 180 tablet, Rfl: 1   lisinopril-hydrochlorothiazide (ZESTORETIC) 20-12.5 MG tablet, TAKE ONE TABLET BY MOUTH DAILY, Disp: 90 tablet, Rfl: 0   metFORMIN (GLUCOPHAGE-XR) 500 MG 24 hr tablet, TAKE TWO TABLETS BY MOUTH TWICE A DAY, Disp: 120 tablet, Rfl: 2  No Known Allergies  Past Medical History:  Diagnosis Date   Cataract    Diabetes mellitus without complication ([place])    Hypertension    Osteopenia 04/28/2021    Past Surgical History:  Procedure Laterality Date   EYE  SURGERY      Family History  Problem Relation Age of Onset   Diabetes Mother    Throat cancer Brother     Social History   Socioeconomic History   Marital status: Married    Spouse name: Not on file   Number of children: Not on file   Years of education: Not on file   Highest education level: Not on file  Occupational History   Not on file  Tobacco Use   Smoking status: Former    Types: Cigarettes    Quit date: 1995    Years since quitting: 28.5   Smokeless tobacco: Never  Vaping Use   Vaping Use: Never used  Substance and Sexual Activity   Alcohol use: Not Currently   Drug use: Never   Sexual activity: Not on file  Other Topics Concern   Not on file  Social History Narrative   Not on file   Social Determinants of Health   Financial Resource Strain: Not on file  Food Insecurity: Not on file  Transportation Needs: Not on file  Physical Activity: Not on file  Stress: Not on file  Social Connections: Not on file  Intimate Partner Violence:  Not on file    Objective:   Today's Vitals: BP 97/64   Pulse 67   Temp 97.6 F (36.4 C) (Temporal)   Ht 5' 1"  (1.549 m)   Wt 118 lb 12.8 oz (53.9 kg)   SpO2 95%   BMI 22.45 kg/m   Physical Exam Vitals reviewed.  Constitutional:      General: She is not in acute distress.    Appearance: Normal appearance. She is normal weight. She is not ill-appearing, toxic-appearing or diaphoretic.  HENT:     Head: Normocephalic and atraumatic.     Right Ear: Tympanic membrane, ear canal and external ear normal. There is no impacted cerumen.     Left Ear: Tympanic membrane, ear canal and external ear normal. There is impacted cerumen.  Eyes:     General: No scleral icterus.       Right eye: No discharge.        Left eye: No discharge.     Conjunctiva/sclera: Conjunctivae normal.  Cardiovascular:     Rate and Rhythm: Normal rate and regular rhythm.     Heart sounds: Normal heart sounds. No murmur heard.    No friction rub. No  gallop.  Pulmonary:     Effort: Pulmonary effort is normal. No respiratory distress.     Breath sounds: Normal breath sounds. No stridor. No wheezing, rhonchi or rales.  Musculoskeletal:        General: Normal range of motion.     Cervical back: Normal range of motion.  Skin:    General: Skin is warm and dry.     Capillary Refill: Capillary refill takes less than 2 seconds.  Neurological:     General: No focal deficit present.     Mental Status: She is alert and oriented to person, place, and time. Mental status is at baseline.  Psychiatric:        Attention and Perception: Attention normal.        Mood and Affect: Mood normal.        Speech: She is noncommunicative.        Behavior: Behavior normal.

## 2022-03-30 ENCOUNTER — Encounter: Payer: Self-pay | Admitting: Family Medicine

## 2022-03-30 LAB — LIPID PANEL
Chol/HDL Ratio: 2.3 ratio (ref 0.0–4.4)
Cholesterol, Total: 73 mg/dL — ABNORMAL LOW (ref 100–199)
HDL: 32 mg/dL — ABNORMAL LOW (ref >39)
LDL Chol Calc (NIH): 17 mg/dL (ref 0–99)
Triglycerides: 136 mg/dL (ref 0–149)
VLDL Cholesterol Cal: 24 mg/dL (ref 5–40)

## 2022-03-30 LAB — CMP14+EGFR
ALT: 31 IU/L (ref 0–32)
AST: 24 IU/L (ref 0–40)
Albumin/Globulin Ratio: 1.9 (ref 1.2–2.2)
Albumin: 4.3 g/dL (ref 3.9–4.9)
Alkaline Phosphatase: 44 IU/L (ref 44–121)
BUN/Creatinine Ratio: 24 (ref 12–28)
BUN: 19 mg/dL (ref 8–27)
Bilirubin Total: 0.5 mg/dL (ref 0.0–1.2)
CO2: 17 mmol/L — ABNORMAL LOW (ref 20–29)
Calcium: 9.6 mg/dL (ref 8.7–10.3)
Chloride: 109 mmol/L — ABNORMAL HIGH (ref 96–106)
Creatinine, Ser: 0.78 mg/dL (ref 0.57–1.00)
Globulin, Total: 2.3 g/dL (ref 1.5–4.5)
Glucose: 143 mg/dL — ABNORMAL HIGH (ref 70–99)
Potassium: 4.2 mmol/L (ref 3.5–5.2)
Sodium: 140 mmol/L (ref 134–144)
Total Protein: 6.6 g/dL (ref 6.0–8.5)
eGFR: 83 mL/min/{1.73_m2} (ref >59)

## 2022-03-30 LAB — CBC WITH DIFFERENTIAL/PLATELET
Basophils Absolute: 0.1 10*3/uL (ref 0.0–0.2)
Basos: 1 %
EOS (ABSOLUTE): 0.4 10*3/uL (ref 0.0–0.4)
Eos: 7 %
Hematocrit: 41 % (ref 34.0–46.6)
Hemoglobin: 13.7 g/dL (ref 11.1–15.9)
Immature Grans (Abs): 0 10*3/uL (ref 0.0–0.1)
Immature Granulocytes: 0 %
Lymphocytes Absolute: 1.5 10*3/uL (ref 0.7–3.1)
Lymphs: 28 %
MCH: 29.4 pg (ref 26.6–33.0)
MCHC: 33.4 g/dL (ref 31.5–35.7)
MCV: 88 fL (ref 79–97)
Monocytes Absolute: 0.4 10*3/uL (ref 0.1–0.9)
Monocytes: 7 %
Neutrophils Absolute: 3.1 10*3/uL (ref 1.4–7.0)
Neutrophils: 57 %
Platelets: 161 10*3/uL (ref 150–450)
RBC: 4.66 x10E6/uL (ref 3.77–5.28)
RDW: 13 % (ref 11.7–15.4)
WBC: 5.6 10*3/uL (ref 3.4–10.8)

## 2022-04-07 NOTE — Patient Instructions (Signed)
Our records indicate that you are due for your annual mammogram/breast imaging. While there is no way to prevent breast cancer, early detection provides the best opportunity for curing it. For women over the age of 40, the American Cancer Society recommends a yearly clinical breast exam and a yearly mammogram. These practices have saved thousands of lives. We need your help to ensure that you are receiving optimal medical care. Please call the imaging location that has done you previous mammograms. Please remember to list us as your primary care. This helps make sure we receive a report and can update your chart.  Below is the contact information for several local breast imaging centers. You may call the location that works best for you, and they will be happy to assistance in making you an appointment. You do not need an order for a regular screening mammogram. However, if you are having any problems or concerns with you breast area, please let your primary care provider know, and appropriate orders will be placed. Please let our office know if you have any questions or concerns. Or if you need information for another imaging center not on this list or outside of the area. We are commented to working with you on your health care journey.   The mobile unit/bus ([facility]) - they come twice a month to our location.  These appointments can be made through our office or by call The Breast Center  [facility]  [address] [place], [place] [address] Phone [phone]  [facility]iology Department  [address]  [place], [place] [address] [phone]  [facility])  [address] [place], [place] [address] [phone]  [facility] [place]  [address] [place] [address] [phone]  [facility]  [address], [address] [place] [address] [phone]  [facility] [place]  [address] [place], [place] [address] [phone]  [facility] [address] [place], [place] [address] [phone]  [facility] [address]  [address] [place],  [address] [phone]  [facility] [address] [place] [address] [phone]     

## 2022-04-12 ENCOUNTER — Ambulatory Visit (INDEPENDENT_AMBULATORY_CARE_PROVIDER_SITE_OTHER): Payer: Medicare Other | Admitting: Family Medicine

## 2022-04-12 ENCOUNTER — Encounter: Payer: Self-pay | Admitting: Family Medicine

## 2022-04-12 VITALS — BP 107/73 | HR 60 | Temp 97.7°F | Ht 61.0 in | Wt 121.2 lb

## 2022-04-12 DIAGNOSIS — H6122 Impacted cerumen, left ear: Secondary | ICD-10-CM | POA: Diagnosis not present

## 2022-04-12 NOTE — Progress Notes (Signed)
Assessment & Plan:  1. Impacted cerumen of left ear Unable to remove with irrigation or curette. Referring to ENT. - Ambulatory referral to ENT   Follow up plan: Return as scheduled.  [name], MSN, APRN, FNP-C [facility]  Subjective:   Patient ID: Barbara Schroeder, female    DOB: 1950/09/29, 71 y.o.   MRN: [id number]  HPI: Barbara Schroeder is a 71 y.o. female presenting on 04/12/2022 for 2 Week Follow-up (Left ear )  Patient is accompanied by her husband who is her caregiver.  Patient is nonverbal.  Patient was seen and treated for otitis externa 2 weeks ago.   ROS: Negative unless specifically indicated above in HPI.   Relevant past medical history reviewed and updated as indicated.   Allergies and medications reviewed and updated.   Current Outpatient Medications:    atenolol (TENORMIN) 50 MG tablet, Take 1 tablet (50 mg total) by mouth daily., Disp: 90 tablet, Rfl: 1   atorvastatin (LIPITOR) 20 MG tablet, Take 1 tablet (20 mg total) by mouth daily., Disp: 90 tablet, Rfl: 1   dapagliflozin propanediol (FARXIGA) 10 MG TABS tablet, Take 1 tablet (10 mg total) by mouth daily before breakfast., Disp: 90 tablet, Rfl: 3   glipiZIDE (GLUCOTROL) 10 MG tablet, Take 1 tablet (10 mg total) by mouth 2 (two) times daily., Disp: 180 tablet, Rfl: 1   levocetirizine (XYZAL) 5 MG tablet, Take 1 tablet (5 mg total) by mouth every evening., Disp: 30 tablet, Rfl: 2   lisinopril-hydrochlorothiazide (ZESTORETIC) 20-12.5 MG tablet, TAKE ONE TABLET BY MOUTH DAILY, Disp: 90 tablet, Rfl: 0   metFORMIN (GLUCOPHAGE-XR) 500 MG 24 hr tablet, Take 2 tablets (1,000 mg total) by mouth 2 (two) times daily., Disp: 360 tablet, Rfl: 1  No Known Allergies  Objective:   BP 107/73   Pulse 60   Temp 97.7 F (36.5 C) (Temporal)   Ht 5\' 1"  (1.549 m)   Wt 121 lb 3.2 oz (55 kg)   SpO2 96%   BMI 22.90 kg/m    Physical Exam Vitals reviewed.  Constitutional:      General:  She is not in acute distress.    Appearance: Normal appearance. She is not ill-appearing, toxic-appearing or diaphoretic.  HENT:     Head: Normocephalic and atraumatic.     Right Ear: Decreased hearing noted.     Left Ear: Tympanic membrane and external ear normal. Decreased hearing noted. Tenderness present. No laceration or drainage.  No middle ear effusion. There is impacted cerumen.  Eyes:     General: No scleral icterus.       Right eye: No discharge.        Left eye: No discharge.     Conjunctiva/sclera: Conjunctivae normal.  Cardiovascular:     Rate and Rhythm: Normal rate.  Pulmonary:     Effort: Pulmonary effort is normal. No respiratory distress.  Musculoskeletal:        General: Normal range of motion.     Cervical back: Normal range of motion.  Skin:    General: Skin is warm and dry.     Capillary Refill: Capillary refill takes less than 2 seconds.  Neurological:     General: No focal deficit present.     Mental Status: She is alert and oriented to person, place, and time. Mental status is at baseline.  Psychiatric:        Mood and Affect: Mood normal.        Behavior: Behavior normal.  Thought Content: Thought content normal.        Judgment: Judgment normal.

## 2022-04-18 ENCOUNTER — Other Ambulatory Visit: Payer: Self-pay | Admitting: Family Medicine

## 2022-06-17 ENCOUNTER — Other Ambulatory Visit: Payer: Self-pay | Admitting: Family Medicine

## 2022-07-01 ENCOUNTER — Other Ambulatory Visit: Payer: Self-pay

## 2022-07-01 ENCOUNTER — Ambulatory Visit (INDEPENDENT_AMBULATORY_CARE_PROVIDER_SITE_OTHER): Payer: Medicare Other

## 2022-07-01 DIAGNOSIS — Z0000 Encounter for general adult medical examination without abnormal findings: Secondary | ICD-10-CM

## 2022-07-01 DIAGNOSIS — Z748 Other problems related to care provider dependency: Secondary | ICD-10-CM

## 2022-07-01 NOTE — Progress Notes (Signed)
MEDICARE ANNUAL WELLNESS VISIT  07/01/2022  Telephone Visit Disclaimer This Medicare AWV was conducted by telephone due to national recommendations for restrictions regarding the COVID-19 Pandemic (e.g. social distancing).  I verified, using two identifiers, that I am speaking with Barbara Schroeder or their authorized healthcare agent. I discussed the limitations, risks, security, and privacy concerns of performing an evaluation and management service by telephone and the potential availability of an in-person appointment in the future. The patient expressed understanding and agreed to proceed.  Location of Patient: Home Location of Provider (nurse):  [facility]  Subjective:    Barbara Schroeder is a 71 y.o. female patient of Rakes, Connye Burkitt, FNP who had a Medicare Annual Wellness Visit today via telephone. Barbara Schroeder is non verbal so her husband helped [place] along with the visit. He was able to answer all the questions that we had. We were unable to complete an MMSE. Barbara Schroeder and her husband have been married for 37 years and they have one son together. He lives in [place]. Her husband says that he tries to get her on the treadmill at the slowest speed but she isn't always able to walk on it. He states that she gets around the house just fine and can do a lot of things for herself. He usually has to assist her with some things such as meals and medication. He says they work together. She enjoys going through her jewelry collection and trying on things, watching TV, and she has a game on her husbands phone that she enjoys playing. She is disabled   Patient Care Team: Baruch Gouty, FNP as PCP - General (Family Medicine) Lavera Guise, [facility] as Pharmacist (Family Medicine)     07/01/2022    2:51 PM 05/25/2021    4:17 PM  Advanced Directives  Does Patient Have a Medical Advance Directive? No No  Would patient like information on creating a medical advance directive?  No - Patient declined No - Guardian declined    Hospital Utilization Over the Past 12 Months: # of hospitalizations or ER visits: 0 # of surgeries: 0  Review of Systems    Patient reports that her overall health is unchanged compared to last year.  History obtained from chart review  Patient Reported Readings (BP, Pulse, CBG, Weight, etc) none  Pain Assessment Pain : No/denies pain     Current Medications & Allergies (verified) Allergies as of 07/01/2022   No Known Allergies      Medication List        Accurate as of July 01, 2022  3:21 PM. If you have any questions, ask your nurse or doctor.          atenolol 50 MG tablet Commonly known as: TENORMIN Take 1 tablet (50 mg total) by mouth daily.   atorvastatin 20 MG tablet Commonly known as: LIPITOR Take 1 tablet (20 mg total) by mouth daily.   dapagliflozin propanediol 10 MG Tabs tablet Commonly known as: Farxiga Take 1 tablet (10 mg total) by mouth daily before breakfast.   glipiZIDE 10 MG tablet Commonly known as: GLUCOTROL Take 1 tablet (10 mg total) by mouth 2 (two) times daily.   levocetirizine 5 MG tablet Commonly known as: XYZAL Take 1 tablet (5 mg total) by mouth every evening.   lisinopril-hydrochlorothiazide 20-12.5 MG tablet Commonly known as: ZESTORETIC TAKE 1 TABLET BY MOUTH DAILY   metFORMIN 500 MG 24 hr tablet Commonly known as: GLUCOPHAGE-XR Take 2  tablets (1,000 mg total) by mouth 2 (two) times daily.        History (reviewed): Past Medical History:  Diagnosis Date   Cataract    Diabetes mellitus without complication (HCC)    Hypertension    Osteopenia 04/28/2021   Past Surgical History:  Procedure Laterality Date   EYE SURGERY     Family History  Problem Relation Age of Onset   Diabetes Mother    Throat cancer Brother    Social History   Socioeconomic History   Marital status: Married    Spouse name: Not on file   Number of children: Not on file   Years of  education: Not on file   Highest education level: Not on file  Occupational History   Occupation: Disabled  Tobacco Use   Smoking status: Former    Types: Cigarettes    Quit date: 1995    Years since quitting: 28.8   Smokeless tobacco: Never  Vaping Use   Vaping Use: Never used  Substance and Sexual Activity   Alcohol use: Not Currently   Drug use: Never   Sexual activity: Not on file  Other Topics Concern   Not on file  Social History Narrative   Not on file   Social Determinants of Health   Financial Resource Strain: Low Risk  (07/01/2022)   Overall Financial Resource Strain (CARDIA)    Difficulty of Paying Living Expenses: Not hard at all  Food Insecurity: No Food Insecurity (07/01/2022)   Hunger Vital Sign    Worried About Running Out of Food in the Last Year: Never true    Ran Out of Food in the Last Year: Never true  Transportation Needs: Unmet Transportation Needs (07/01/2022)   PRAPARE - Administrator, Civil Service (Medical): Yes    Lack of Transportation (Non-Medical): Yes  Physical Activity: Inactive (07/01/2022)   Exercise Vital Sign    Days of Exercise per Week: 0 days    Minutes of Exercise per Session: 0 min  Stress: No Stress Concern Present (07/01/2022)   [employer] of Occupational Health - Occupational Stress Questionnaire    Feeling of Stress : Not at all  Social Connections: Moderately Isolated (07/01/2022)   Social Connection and Isolation Panel [NHANES]    Frequency of Communication with Friends and Family: Twice a week    Frequency of Social Gatherings with Friends and Family: Once a week    Attends Religious Services: Never    Database administrator or Organizations: No    Attends Banker Meetings: Never    Marital Status: Married   Referral to CCM done for transportation issues  Activities of Daily Living    07/01/2022    2:52 PM  In your present state of health, do you have any difficulty performing the  following activities:  Hearing? 0  Vision? 0  Difficulty concentrating or making decisions? 0  Walking or climbing stairs? 0  Dressing or bathing? 0  Doing errands, shopping? 0  Preparing Food and eating ? N  Using the Toilet? N  In the past six months, have you accidently leaked urine? N  Do you have problems with loss of bowel control? N  Managing your Medications? N  Managing your Finances? N  Housekeeping or managing your Housekeeping? N    Patient Education/ Literacy What is the last grade level you completed in school?: Husband is not sure. Maybe junior high  Exercise Current Exercise Habits: The patient does  not participate in regular exercise at present, Exercise limited by: neurologic condition(s)  Diet Patient reports consuming 3 meals a day and 1 snack(s) a day Patient reports that her primary diet is: Regular Patient reports that she does have regular access to food.   Depression Screen    07/01/2022    2:52 PM 04/12/2022    2:33 PM 09/29/2021    9:16 AM 04/27/2021    2:19 PM 10/28/2020    1:15 PM  PHQ 2/9 Scores  PHQ - 2 Score 0 0 0 0 0  PHQ- 9 Score  2 2 4       Fall Risk    07/01/2022    2:52 PM 04/12/2022    2:33 PM 09/29/2021    9:16 AM 05/25/2021    4:17 PM 04/27/2021    2:19 PM  Fall Risk   Falls in the past year? 0 0 0 0 0     Objective:  06/27/2021 seemed alert and oriented and she participated appropriately during our telephone visit. Pt is non verbal but was present during the visit   Blood Pressure Weight BMI  BP Readings from Last 3 Encounters:  04/12/22 107/73  03/29/22 97/64  09/29/21 106/75   Wt Readings from Last 3 Encounters:  04/12/22 121 lb 3.2 oz (55 kg)  03/29/22 118 lb 12.8 oz (53.9 kg)  09/29/21 122 lb 3.2 oz (55.4 kg)   BMI Readings from Last 1 Encounters:  04/12/22 22.90 kg/m    *Unable to obtain current vital signs, weight, and BMI due to telephone visit type  Hearing/Vision  Barbara Schroeder did not seem to have difficulty  with hearing/understanding during the telephone conversation Reports that she has not had a formal eye exam by an eye care professional within the past year Reports that she has not had a formal hearing evaluation within the past year *Unable to fully assess hearing and vision during telephone visit type  Husband states that they have bought hearing aids for patients twice and she will not wear them  Cognitive Function:     No data to display         (Normal:0-7, Significant for Dysfunction: >8)  Normal Cognitive Function Screening: No: 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 so we were unable to complete   Immunization & Health Maintenance Record Immunization History  Administered Date(s) Administered   Influenza, Seasonal, Injecte, Preservative Fre 08/01/2016   Influenza,inj,Quad PF,6+ Mos 08/08/2018, 05/21/2019   Influenza-Unspecified 07/30/2020   Moderna Covid-19 Vaccine Bivalent Booster 28yrs & up 06/25/2021   PFIZER(Purple Top)SARS-COV-2 Vaccination 10/25/2019, 11/15/2019, 07/22/2020   Pneumococcal Conjugate-13 02/06/2019   Pneumococcal Polysaccharide-23 01/09/2018   Tdap 12/02/2019    Health Maintenance  Topic Date Due   FOOT EXAM  04/27/2022   OPHTHALMOLOGY EXAM  07/01/2022 (Originally 07/28/2021)   COVID-19 Vaccine (5 - Pfizer series) 07/17/2022 (Originally 10/26/2021)   Zoster Vaccines- Shingrix (1 of 2) 10/01/2022 (Originally 09/09/2003)   INFLUENZA VACCINE  12/18/2022 (Originally 04/19/2022)   MAMMOGRAM  03/30/2023 (Originally 09/09/2003)   COLONOSCOPY (Pts 45-41yrs Insurance coverage will need to be confirmed)  07/02/2023 (Originally 09/08/1998)   Diabetic kidney evaluation - Urine ACR  09/29/2022   HEMOGLOBIN A1C  09/29/2022   Pneumonia Vaccine 76+ Years old (3 - PPSV23 or PCV20) 01/10/2023   Diabetic kidney evaluation - GFR measurement  03/30/2023   DEXA SCAN  04/27/2024   TETANUS/TDAP  12/01/2029   Hepatitis C Screening  Completed   HPV VACCINES   Aged  Out       Assessment  This is a routine wellness examination for [employer].  Health Maintenance: Due or Overdue Health Maintenance Due  Topic Date Due   FOOT EXAM  04/27/2022    Barbara Schroeder does not need a referral for Community Assistance: Care Management:   no Social Work:    no Prescription Assistance:  no Nutrition/Diabetes Education:  no   Plan:  Personalized Goals  Goals Addressed             This Visit's Progress    DIET - EAT MORE FRUITS AND VEGETABLES         Personalized Health Maintenance & Screening Recommendations  Influenza vaccine  Lung Cancer Screening Recommended: no (Low Dose CT Chest recommended if Age 68-80 years, 30 pack-year currently smoking OR have quit w/in past 15 years) Hepatitis C Screening recommended: Done HIV Screening recommended: no  Advanced Directives: Written information was not prepared per patient's request.  Referrals & Orders No orders of the defined types were placed in this encounter.   Follow-up Plan Follow-up with Rakes, Doralee Albino, FNP as planned Schedule for seasonal flu vaccine   I have personally reviewed and noted the following in the patient's chart:   Medical and social history Use of alcohol, tobacco or illicit drugs  Current medications and supplements Functional ability and status Nutritional status Physical activity Advanced directives List of other physicians Hospitalizations, surgeries, and ER visits in previous 12 months Vitals Screenings to include cognitive, depression, and falls Referrals and appointments  In addition, I have reviewed and discussed with Barbara Schroeder certain preventive protocols, quality metrics, and best practice recommendations. A written personalized care plan for preventive services as well as general preventive health recommendations is available and can be mailed to the patient at her request.      Cleda Daub LPN 79/10/4095

## 2022-07-04 ENCOUNTER — Ambulatory Visit: Payer: Self-pay | Admitting: Licensed Clinical Social Worker

## 2022-07-04 NOTE — Patient Instructions (Signed)
Visit Information  Thank you for taking time to visit with me today. Please don't hesitate to contact me if I can be of assistance to you.   Following are the goals we discussed today:   Goals Addressed               This Visit's Progress     Care Coordination Activities- Transportation (pt-stated)        Patient needing assistance with transportation.  SW submitted [id number] referrals for Always [facility] of [place], [place], [place] and [place]. SW also referred patients to [facility] Division of Aging and Adult Restaurant manager, fast food or  call  [phone].   SW emailed information to patient and educated patient on information.          Patient verbalizes understanding of instructions and care plan provided today and agrees to view in [place]. Active MyChart status and patient understanding of how to access instructions and care plan via MyChart confirmed with patient.     The care management team will reach out to the patient again over the next 30 days.   Milus Height, MSW, [place] Social Worker [facility] Care Management  [phone]

## 2022-07-04 NOTE — Patient Outreach (Signed)
  Care Coordination   Initial Visit Note   07/04/2022 Name: Barbara Schroeder MRN: [id number] DOB: May 14, 1951  Barbara Schroeder is a 71 y.o. year old female who sees Rakes, Connye Burkitt, FNP for primary care. I spoke with  France Ravens by phone today.  What matters to the patients health and wellness today?  Transportation    Goals Addressed               This [place]- Transportation (pt-stated)        Patient needing assistance with transportation.  SW submitted [id number] referrals for Always [facility] of [place], [place], [place] and [place]. SW also referred patients to [facility] Division of Aging and Adult Restaurant manager, fast food or  call  [phone].   SW emailed information to patient and educated patient on information.        SDOH assessments and interventions completed:  Yes  SDOH Interventions Today    Flowsheet Row Most Recent Value  SDOH Interventions   Transportation Interventions [id number] Referral, Other (Comment)  [Referred Patient to [place] Division of Aging and Adult Restaurant manager, fast food or  call  [phone].]        Care Coordination Interventions Activated:  Yes  Care Coordination Interventions:  Yes, provided   Follow up plan: No further intervention required.   Encounter Outcome:  Pt. Visit Completed

## 2022-07-04 NOTE — Patient Outreach (Signed)
  Care Coordination   07/04/2022 Name: Barbara Schroeder MRN: [id number] DOB: [birth year]   Care Coordination Outreach Attempts:  An unsuccessful telephone outreach was attempted today to offer the patient information about available care coordination services as a benefit of their health plan.   Follow Up Plan:  Additional outreach attempts will be made to offer the patient care coordination information and services.   Encounter Outcome:  No Answer  Care Coordination Interventions Activated:  No   Care Coordination Interventions:  Yes, provided    Milus Height, .MSW, [place] Social Worker [facility] Care Management  [phone]

## 2022-07-25 ENCOUNTER — Ambulatory Visit: Payer: Self-pay | Admitting: Licensed Clinical Social Worker

## 2022-07-25 NOTE — Patient Outreach (Signed)
  Care Coordination   07/25/2022 Name: Barbara Schroeder MRN: [id number] DOB: [birth year]   Care Coordination Outreach Attempts:  An unsuccessful telephone outreach was attempted today to offer the patient information about available care coordination services as a benefit of their health plan.   Follow Up Plan:  No further outreach attempts will be made at this time. We have been unable to contact the patient to offer or enroll patient in care coordination services  Encounter Outcome:  Pt. Visit Completed  Care Coordination Interventions Activated:  Yes   Care Coordination Interventions:  Yes, provided    Milus Height, [place] Worker [facility] Care Management  [phone]

## 2022-08-03 ENCOUNTER — Telehealth: Payer: Self-pay | Admitting: Pharmacist

## 2022-08-03 DIAGNOSIS — E1165 Type 2 diabetes mellitus with hyperglycemia: Secondary | ICD-10-CM

## 2022-08-03 MED ORDER — DAPAGLIFLOZIN PROPANEDIOL 10 MG PO TABS: 10.0000 mg | ORAL_TABLET | Freq: Every day | ORAL | 5 refills | Status: DC

## 2022-08-03 NOTE — Telephone Encounter (Signed)
Did she bring her page to the office? I dont have one on my end, I only faxed the income to them (from email). The automated system says her re-enrollment is pending.

## 2022-08-03 NOTE — Telephone Encounter (Signed)
Please send patient portion and PCP portion for AZ&me PAP re-enrollment (if not already approved): Farxiga 10ng daily Escribed to [facility] x1 yr refills   Patient is non-verbal so I communicate with her husband if you need to discuss something re: PAP, etc  Thank you!

## 2022-08-09 ENCOUNTER — Telehealth: Payer: Medicare Other

## 2022-08-09 NOTE — Telephone Encounter (Signed)
No, they didn't! I"m happy to sign for patient if you can email/scan me the paperwork  Thank you!

## 2022-08-13 ENCOUNTER — Other Ambulatory Visit: Payer: Self-pay | Admitting: Family Medicine

## 2022-08-13 DIAGNOSIS — I10 Essential (primary) hypertension: Secondary | ICD-10-CM

## 2022-08-29 ENCOUNTER — Other Ambulatory Visit: Payer: Self-pay | Admitting: Family Medicine

## 2022-08-29 ENCOUNTER — Telehealth: Payer: Self-pay | Admitting: Family Medicine

## 2022-08-29 MED ORDER — BLOOD GLUCOSE MONITOR KIT: PACK | 0 refills | Status: DC

## 2022-08-29 NOTE — Telephone Encounter (Signed)
Rx sent to pharmacy for Glucometer and lancets and test strips and pt's husband is aware.

## 2022-08-29 NOTE — Telephone Encounter (Signed)
Pts husband said to resend the glucose meter and supplies Rx's to [facility] in [place] because they did not receive.

## 2022-09-08 ENCOUNTER — Ambulatory Visit (INDEPENDENT_AMBULATORY_CARE_PROVIDER_SITE_OTHER): Payer: Medicare Other | Admitting: Pharmacist

## 2022-09-08 DIAGNOSIS — E1165 Type 2 diabetes mellitus with hyperglycemia: Secondary | ICD-10-CM

## 2022-09-08 DIAGNOSIS — E782 Mixed hyperlipidemia: Secondary | ICD-10-CM

## 2022-09-08 NOTE — Progress Notes (Signed)
Chronic Care Management Pharmacy Note  09/08/2022 Name:  Barbara Schroeder MRN:  [id number] DOB:  06-Dec-1950  Summary:  Diabetes: Controlled-A1C 6.6%, GFR 83 current treatment: metformin, glipizide, FARXIGA 10mg ;  Patient could not afford jardiance (tolerated samples well) CONTINUE METFORMIN & GLIPIZIDE FOR NOW CONTINUE  farxiga 10MG  DAILY (re-enroll via AZ&me) Tolerating well per husband (patient is non verbal) Current glucose readings: fasting glucose: <160, post prandial glucose: <185 Denies hypoglycemic/hyperglycemic symptoms Discussed meal planning options and Plate method for healthy eating Avoid sugary drinks and desserts Incorporate balanced protein, non starchy veggies, 1 serving of carbohydrate with each meal Increase water intake Increase physical activity as able  Current exercise: N/A Educated on FARXIGA; OTHER DM MEDS Assessed patient finances. Re-enrollment sent for FARXIGA ([place]) 2024   Hyperlipidemia: Controlled; continue current management Lipid Panel     Component Value Date/Time   CHOL 73 (L) 03/29/2022 1321   TRIG 136 03/29/2022 1321   HDL 32 (L) 03/29/2022 1321   CHOLHDL 2.3 03/29/2022 1321   LDLCALC 17 03/29/2022 1321   LABVLDL 24 03/29/2022 1321     Patient Goals/Self-Care Activities Over the next 90 days, patient will:  - take medications as prescribed check glucose DAILY (FASTING), document, and provide at future appointments collaborate with provider on medication access solutions  Follow Up Plan: Telephone follow up appointment with care management team member scheduled for: 3 MONTHS   Subjective: Barbara Schroeder is an 71 y.o. year old female who is a primary patient of Rakes, Connye Burkitt, [place].  The patient was referred to the Chronic Care Management team for assistance with care management needs subsequent to provider initiation of CCM services and plan of care.    Engaged with patient by telephone for follow up visit in response to  provider referral for CCM services.   Objective:  LABS:    Lab Results  Component Value Date   CREATININE 0.78 03/29/2022   CREATININE 0.89 09/29/2021   CREATININE 0.81 04/27/2021     Lab Results  Component Value Date   HGBA1C 6.6 (H) 03/29/2022         Component Value Date/Time   CHOL 73 (L) 03/29/2022 1321   TRIG 136 03/29/2022 1321   HDL 32 (L) 03/29/2022 1321   CHOLHDL 2.3 03/29/2022 1321   LDLCALC 17 03/29/2022 1321     Clinical ASCVD: No   The ASCVD Risk score (Arnett DK, et al., 2019) failed to calculate for the following reasons:   The valid total cholesterol range is 130 to 320 mg/dL    Other: (CHADS2VASc if Afib, PHQ9 if depression, MMRC or CAT for COPD, ACT, DEXA)    BP Readings from Last 3 Encounters:  04/12/22 107/73  03/29/22 97/64  09/29/21 106/75      SDOH:  (Social Determinants of Health) assessments and interventions performed:    No Known Allergies  Medications Reviewed Today     Reviewed by Lavera Guise, RPH (Pharmacist) on 08/03/22 at 1008  Med List Status: <None>   Medication Order Taking? Sig Documenting Provider Last Dose Status Informant  atenolol (TENORMIN) 50 MG tablet [id number] No Take 1 tablet (50 mg total) by mouth daily. Loman Brooklyn, FNP Taking Active   atorvastatin (LIPITOR) 20 MG tablet [id number] No Take 1 tablet (20 mg total) by mouth daily. Loman Brooklyn, FNP Taking Active   dapagliflozin propanediol (FARXIGA) 10 MG TABS tablet [id number]  Take 1 tablet (10 mg total) by mouth daily before breakfast. Rakes, Connye Burkitt, FNP  Active   glipiZIDE (GLUCOTROL) 10 MG tablet [id number] No Take 1 tablet (10 mg total) by mouth 2 (two) times daily. Gwenlyn Fudge, FNP Taking Active   levocetirizine (XYZAL) 5 MG tablet [id number] No Take 1 tablet (5 mg total) by mouth every evening. [name] F, FNP Taking Active   lisinopril-hydrochlorothiazide (ZESTORETIC) 20-12.5 MG tablet [id number] No TAKE 1 TABLET BY MOUTH DAILY Rakes, Doralee Albino, FNP Taking Active   metFORMIN (GLUCOPHAGE-XR) 500 MG 24 hr tablet [id number] No Take 2 tablets (1,000 mg total) by mouth 2 (two) times daily. Gwenlyn Fudge, FNP Taking Active               Goals Addressed               This Visit's Progress     Patient Stated     T2DM PHARMD GOAL (pt-stated)        Current Barriers:  Unable to independently afford treatment regimen Unable to maintain control of T2DM Suboptimal therapeutic regimen for T2DM Patient is non-verbal; husband communicates on her behalf  Pharmacist Clinical Goal(s):  Over the next 90 days, patient will verbalize ability to afford treatment regimen maintain control of t2dm as evidenced by improved glycemic control  through collaboration with PharmD and provider.    Interventions: 1:1 collaboration with Sonny Masters, FNP regarding development and update of comprehensive plan of care as evidenced by provider attestation and co-signature Inter-disciplinary care team collaboration (see longitudinal plan of care) Comprehensive medication review performed; medication list updated in electronic medical record  Diabetes: Controlled-A1C 6.6%, GFR 71 current treatment: metformin, glipizide, FARXIGA 10mg ;  Patient could not afford jardiance (tolerated samples well) CONTINUE METFORMIN & GLIPIZIDE FOR NOW CONTINUE  farxiga 10MG  DAILY (re-enroll via AZ&me) Tolerating well per husband (patient is non verbal) Current glucose readings: fasting glucose: <160, post prandial glucose: <185 Denies hypoglycemic/hyperglycemic symptoms Discussed meal planning options and Plate method for healthy eating Avoid sugary drinks and desserts Incorporate balanced protein, non starchy veggies, 1 serving of carbohydrate with each meal Increase water intake Increase physical activity as able  Current exercise: N/A Educated on FARXIGA; OTHER DM MEDS Assessed patient finances. Re-enrollment sent for FARXIGA ([place]) 2024    Hyperlipidemia: Controlled; continue current management Lipid Panel     Component Value Date/Time   CHOL 73 (L) 03/29/2022 1321   TRIG 136 03/29/2022 1321   HDL 32 (L) 03/29/2022 1321   CHOLHDL 2.3 03/29/2022 1321   LDLCALC 17 03/29/2022 1321   LABVLDL 24 03/29/2022 1321    Patient Goals/Self-Care Activities Over the next 90 days, patient will:  - take medications as prescribed check glucose DAILY (FASTING), document, and provide at future appointments collaborate with provider on medication access solutions  Follow Up Plan: Telephone follow up appointment with care management team member scheduled for: 3 MONTHS         Plan: Telephone follow up appointment with care management team member scheduled for:  3 months     05/30/2022, PharmD, BCPS, BCACP Clinical Pharmacist, [facility] [place]  II  T [phone]

## 2022-09-08 NOTE — Patient Instructions (Signed)
Visit Information  Following are the goals we discussed today:  Current Barriers:  Unable to independently afford treatment regimen Unable to maintain control of T2DM Suboptimal therapeutic regimen for T2DM Patient is non-verbal; husband communicates on her behalf  Pharmacist Clinical Goal(s):  Over the next 90 days, patient will verbalize ability to afford treatment regimen maintain control of t2dm as evidenced by improved glycemic control  through collaboration with PharmD and provider.    Interventions: 1:1 collaboration with Sonny Masters, FNP regarding development and update of comprehensive plan of care as evidenced by provider attestation and co-signature Inter-disciplinary care team collaboration (see longitudinal plan of care) Comprehensive medication review performed; medication list updated in electronic medical record  Diabetes: Controlled-A1C 6.6%, GFR 71 current treatment: metformin, glipizide, FARXIGA 10mg ;  Patient could not afford jardiance (tolerated samples well) CONTINUE METFORMIN & GLIPIZIDE FOR NOW CONTINUE  farxiga 10MG  DAILY (re-enroll via AZ&me) Tolerating well per husband (patient is non verbal) Current glucose readings: fasting glucose: <160, post prandial glucose: <185 Denies hypoglycemic/hyperglycemic symptoms Discussed meal planning options and Plate method for healthy eating Avoid sugary drinks and desserts Incorporate balanced protein, non starchy veggies, 1 serving of carbohydrate with each meal Increase water intake Increase physical activity as able  Current exercise: N/A Educated on FARXIGA; OTHER DM MEDS Assessed patient finances. Re-enrollment sent for FARXIGA (AZ&ME) 2024   Hyperlipidemia: Controlled; continue current management Lipid Panel     Component Value Date/Time   CHOL 73 (L) 03/29/2022 1321   TRIG 136 03/29/2022 1321   HDL 32 (L) 03/29/2022 1321   CHOLHDL 2.3 03/29/2022 1321   LDLCALC 17 03/29/2022 1321   LABVLDL 24  03/29/2022 1321     Patient Goals/Self-Care Activities Over the next 90 days, patient will:  - take medications as prescribed check glucose DAILY (FASTING), document, and provide at future appointments collaborate with provider on medication access solutions  Follow Up Plan: Telephone follow up appointment with care management team member scheduled for: 3 MONTHS   Plan: Telephone follow up appointment with care management team member scheduled for:  3 months  Signature 05/30/2022, PharmD, BCPS, BCACP Clinical Pharmacist, [facility]  II  T [phone]   Please call the care guide team at [phone] if you need to cancel or reschedule your appointment.   The patient verbalized understanding of instructions, educational materials, and care plan provided today and DECLINED offer to receive copy of patient instructions, educational materials, and care plan.

## 2022-09-18 DIAGNOSIS — E785 Hyperlipidemia, unspecified: Secondary | ICD-10-CM

## 2022-09-18 DIAGNOSIS — E1169 Type 2 diabetes mellitus with other specified complication: Secondary | ICD-10-CM

## 2022-09-20 ENCOUNTER — Other Ambulatory Visit: Payer: Self-pay | Admitting: Family Medicine

## 2022-09-20 DIAGNOSIS — E1165 Type 2 diabetes mellitus with hyperglycemia: Secondary | ICD-10-CM

## 2022-09-20 DIAGNOSIS — E782 Mixed hyperlipidemia: Secondary | ICD-10-CM

## 2022-09-20 DIAGNOSIS — R6889 Other general symptoms and signs: Secondary | ICD-10-CM

## 2022-09-23 ENCOUNTER — Other Ambulatory Visit: Payer: Self-pay | Admitting: Family Medicine

## 2022-09-23 DIAGNOSIS — E1165 Type 2 diabetes mellitus with hyperglycemia: Secondary | ICD-10-CM

## 2022-09-23 DIAGNOSIS — E782 Mixed hyperlipidemia: Secondary | ICD-10-CM

## 2022-09-29 ENCOUNTER — Encounter: Payer: Self-pay | Admitting: Family Medicine

## 2022-09-29 ENCOUNTER — Ambulatory Visit (INDEPENDENT_AMBULATORY_CARE_PROVIDER_SITE_OTHER): Payer: Medicare Other | Admitting: Family Medicine

## 2022-09-29 VITALS — BP 117/64 | HR 64 | Temp 96.8°F | Ht 61.0 in | Wt 125.4 lb

## 2022-09-29 DIAGNOSIS — E1169 Type 2 diabetes mellitus with other specified complication: Secondary | ICD-10-CM | POA: Diagnosis not present

## 2022-09-29 DIAGNOSIS — E785 Hyperlipidemia, unspecified: Secondary | ICD-10-CM | POA: Diagnosis not present

## 2022-09-29 DIAGNOSIS — I152 Hypertension secondary to endocrine disorders: Secondary | ICD-10-CM

## 2022-09-29 DIAGNOSIS — E1159 Type 2 diabetes mellitus with other circulatory complications: Secondary | ICD-10-CM

## 2022-09-29 LAB — BAYER DCA HB A1C WAIVED: HB A1C (BAYER DCA - WAIVED): 6.9 % — ABNORMAL HIGH (ref 4.8–5.6)

## 2022-09-29 NOTE — Patient Instructions (Addendum)

## 2022-09-29 NOTE — Progress Notes (Signed)
Subjective:  Patient ID: Barbara Schroeder, female    DOB: [birth year], 72 y.o.   MRN: [id number]  Patient Care Team: Baruch Gouty, FNP as PCP - General (Family Medicine) Lavera Guise, [facility] as Pharmacist (Family Medicine)   Chief Complaint:  Establish Care Blanch Media patient ) and Medical Management of Chronic Issues   HPI: Barbara Schroeder is a 72 y.o. female presenting on 09/29/2022 for Establish Care Blanch Media patient ) and Medical Management of Chronic Issues  Pt presents today to establish care with new PCP and for management of chronic medial conditions.   1. Controlled type 2 diabetes mellitus with other specified complication, without long-term current use of insulin (HCC) Pt is nonverbal and is accompanied by her spouse who is her primary caregiver. He states she is doing well and blood sugars have been well controlled. Spouse denies increased hunger, thirst, or urination. Taking medications as prescribed.   2. Hyperlipidemia associated with type 2 diabetes mellitus ([place]) Pt is on statin therapy and is tolerating well. Does not follow a specific diet or exercise routine.   3. Hypertension associated with diabetes ([place]) Does not check blood pressures. Is complaint with medications. No leg swelling reported.      Relevant past medical, surgical, family, and social history reviewed and updated as indicated.  Allergies and medications reviewed and updated. Data reviewed: Chart in Epic.   Past Medical History:  Diagnosis Date   Cataract    Diabetes mellitus without complication ([place])    Hypertension    Osteopenia 04/28/2021    Past Surgical History:  Procedure Laterality Date   EYE SURGERY      Social History   Socioeconomic History   Marital status: Married    Spouse name: Not on file   Number of children: Not on file   Years of education: Not on file   Highest education level: Not on file  Occupational History   Occupation: Disabled  Tobacco Use   Smoking  status: Former    Types: Cigarettes    Quit date: 1995    Years since quitting: 29.0   Smokeless tobacco: Never  Vaping Use   Vaping Use: Never used  Substance and Sexual Activity   Alcohol use: Not Currently   Drug use: Never   Sexual activity: Not on file  Other Topics Concern   Not on file  Social History Narrative   Not on file   Social Determinants of Health   Financial Resource Strain: [place]  (07/01/2022)   Overall Financial Resource Strain (CARDIA)    Difficulty of Paying Living Expenses: Not hard at all  Food Insecurity: No Food Insecurity (07/01/2022)   Hunger Vital Sign    Worried About Running Out of Food in the Last Year: Never true    [place] in the Last Year: Never true  Transportation Needs: Unmet Transportation Needs (07/01/2022)   [place] - Hydrologist (Medical): Yes    Lack of Transportation (Non-Medical): Yes  Physical Activity: Inactive (07/01/2022)   Exercise Vital Sign    Days of Exercise per Week: 0 days    Minutes of Exercise per Session: 0 min  Stress: No Stress Concern Present (07/01/2022)   [place] of Stress : Not at all  Social Connections: Moderately Isolated (07/01/2022)   Social Connection and Isolation Panel [NHANES]    Frequency of Communication with Friends and  Family: Twice a week    Frequency of Social Gatherings with Friends and Family: Once a week    Attends Religious Services: Never    Database administrator or Organizations: No    Attends Banker Meetings: Never    Marital Status: Married  Catering manager Violence: Not At Risk (07/01/2022)   Humiliation, Afraid, Rape, and Kick questionnaire    Fear of Current or Ex-Partner: No    Emotionally Abused: No    Physically Abused: No    Sexually Abused: No    Outpatient Encounter Medications as of 09/29/2022  Medication Sig   atenolol (TENORMIN) 50  MG tablet TAKE 1 TABLET BY MOUTH DAILY   atorvastatin (LIPITOR) 20 MG tablet TAKE 1 TABLET BY MOUTH DAILY   Blood Glucose Monitoring Suppl (ACCU-CHEK GUIDE ME) w/Device KIT Test BS daily Dx E11.65   dapagliflozin propanediol (FARXIGA) 10 MG TABS tablet Take 1 tablet (10 mg total) by mouth daily before breakfast.   glipiZIDE (GLUCOTROL) 10 MG tablet TAKE 1 TABLET BY MOUTH TWICE A DAY   levocetirizine (XYZAL) 5 MG tablet TAKE ONE TABLET BY MOUTH EVERY EVENING   lisinopril-hydrochlorothiazide (ZESTORETIC) 20-12.5 MG tablet TAKE 1 TABLET BY MOUTH DAILY   metFORMIN (GLUCOPHAGE-XR) 500 MG 24 hr tablet TAKE 2 TABLETS BY MOUTH TWICE A DAY   No facility-administered encounter medications on file as of 09/29/2022.    No Known Allergies  Review of Systems  Unable to perform ROS: Patient nonverbal        Objective:  BP 117/64   Pulse 64   Temp (!) 96.8 F (36 C) (Temporal)   Ht 5\' 1"  (1.549 m)   Wt 125 lb 6.4 oz (56.9 kg)   SpO2 97%   BMI 23.69 kg/m    Wt Readings from Last 3 Encounters:  09/29/22 125 lb 6.4 oz (56.9 kg)  04/12/22 121 lb 3.2 oz (55 kg)  03/29/22 118 lb 12.8 oz (53.9 kg)    Physical Exam Vitals and nursing note reviewed.  Constitutional:      General: She is not in acute distress.    Appearance: She is normal weight. She is not ill-appearing, toxic-appearing or diaphoretic.  HENT:     Head: Normocephalic and atraumatic.  Eyes:     Pupils: Pupils are equal, round, and reactive to light.  Cardiovascular:     Rate and Rhythm: Normal rate and regular rhythm.     Heart sounds: Normal heart sounds.  Pulmonary:     Effort: Pulmonary effort is normal.     Breath sounds: Normal breath sounds.  Musculoskeletal:     Cervical back: Normal range of motion and neck supple.     Right lower leg: No edema.     Left lower leg: No edema.  Skin:    General: Skin is warm and dry.     Capillary Refill: Capillary refill takes less than 2 seconds.  Neurological:     Mental  Status: She is alert. Mental status is at baseline.     Comments: Nonverbal  Psychiatric:        Behavior: Behavior normal.     Results for orders placed or performed in visit on 03/29/22  Lipid panel  Result Value Ref Range   Cholesterol, Total 73 (L) 100 - 199 mg/dL   Triglycerides 05/30/22 0 - 149 mg/dL   HDL 32 (L) 401 mg/dL   VLDL Cholesterol Cal 24 5 - 40 mg/dL   LDL Chol Calc (NIH) 17  0 - 99 mg/dL   Chol/HDL Ratio 2.3 0.0 - 4.4 ratio  CBC with Differential/Platelet  Result Value Ref Range   WBC 5.6 3.4 - 10.8 x10E3/uL   RBC 4.66 3.77 - 5.28 x10E6/uL   Hemoglobin 13.7 11.1 - 15.9 g/dL   Hematocrit 44.0 10.2 - 46.6 %   MCV 88 79 - 97 fL   MCH 29.4 26.6 - 33.0 pg   MCHC 33.4 31.5 - 35.7 g/dL   RDW 72.5 36.6 - 44.0 %   Platelets 161 150 - 450 x10E3/uL   Neutrophils 57 Not Estab. %   Lymphs 28 Not Estab. %   Monocytes 7 Not Estab. %   Eos 7 Not Estab. %   Basos 1 Not Estab. %   Neutrophils Absolute 3.1 1.4 - 7.0 x10E3/uL   Lymphocytes Absolute 1.5 0.7 - 3.1 x10E3/uL   Monocytes Absolute 0.4 0.1 - 0.9 x10E3/uL   EOS (ABSOLUTE) 0.4 0.0 - 0.4 x10E3/uL   Basophils Absolute 0.1 0.0 - 0.2 x10E3/uL   Immature Granulocytes 0 Not Estab. %   Immature Grans (Abs) 0.0 0.0 - 0.1 x10E3/uL  CMP14+EGFR  Result Value Ref Range   Glucose 143 (H) 70 - 99 mg/dL   BUN 19 8 - 27 mg/dL   Creatinine, Ser 3.47 0.57 - 1.00 mg/dL   eGFR 83 >42 VZ/DGL/8.75   BUN/Creatinine Ratio 24 12 - 28   Sodium 140 134 - 144 mmol/L   Potassium 4.2 3.5 - 5.2 mmol/L   Chloride 109 (H) 96 - 106 mmol/L   CO2 17 (L) 20 - 29 mmol/L   Calcium 9.6 8.7 - 10.3 mg/dL   Total Protein 6.6 6.0 - 8.5 g/dL   Albumin 4.3 3.9 - 4.9 g/dL   Globulin, Total 2.3 1.5 - 4.5 g/dL   Albumin/Globulin Ratio 1.9 1.2 - 2.2   Bilirubin Total 0.5 0.0 - 1.2 mg/dL   Alkaline Phosphatase 44 44 - 121 IU/L   AST 24 0 - 40 IU/L   ALT 31 0 - 32 IU/L  Bayer DCA Hb A1c Waived  Result Value Ref Range   HB A1C (BAYER DCA - WAIVED) 6.6 (H)  4.8 - 5.6 %       Pertinent labs & imaging results that were available during my care of the patient were reviewed by me and considered in my medical decision making.  Assessment & Plan:  Barbara Schroeder was seen today for establish care and medical management of chronic issues.  Diagnoses and all orders for this visit:  Controlled type 2 diabetes mellitus with other specified complication, without long-term current use of insulin (HCC) A1C 6.9 in office. Well controlled, continue current regimen.  -     Lipid panel -     Bayer DCA Hb A1c Waived -     CMP14+EGFR -     Microalbumin / creatinine urine ratio  Hyperlipidemia associated with type 2 diabetes mellitus (HCC) On statin therapy. Labs pending. Diet and exercise encouraged.  -     Lipid panel -     CMP14+EGFR  Hypertension associated with diabetes (HCC) BP well controlled. Changes were not made in regimen today. Goal BP is 130/80. Pt aware to report any persistent high or low readings. DASH diet and exercise encouraged. Exercise at least 150 minutes per week and increase as tolerated. Goal BMI > 25. Stress management encouraged. Avoid nicotine and tobacco product use. Avoid excessive alcohol and NSAID's. Avoid more than 2000 mg of sodium daily. Medications as prescribed. Follow  up as scheduled.  -     CMP14+EGFR -     Microalbumin / creatinine urine ratio     Continue all other maintenance medications.  Follow up plan: Return in about 6 months (around 03/30/2023), or if symptoms worsen or fail to improve, for DM.   Continue healthy lifestyle choices, including diet (rich in fruits, vegetables, and lean proteins, and low in salt and simple carbohydrates) and exercise (at least 30 minutes of moderate physical activity daily).  Educational handout given for DM  The above assessment and management plan was discussed with the patient. The patient verbalized understanding of and has agreed to the management plan. Patient is aware to call  the clinic if they develop any new symptoms or if symptoms persist or worsen. Patient is aware when to return to the clinic for a follow-up visit. Patient educated on when it is appropriate to go to the emergency department.   Monia Pouch, FNP-C [facility] [phone]

## 2022-09-30 LAB — CMP14+EGFR
ALT: 24 IU/L (ref 0–32)
AST: 20 IU/L (ref 0–40)
Albumin/Globulin Ratio: 2.1 (ref 1.2–2.2)
Albumin: 4.1 g/dL (ref 3.9–4.9)
Alkaline Phosphatase: 47 IU/L (ref 44–121)
BUN/Creatinine Ratio: 21 (ref 12–28)
BUN: 17 mg/dL (ref 8–27)
Bilirubin Total: 0.3 mg/dL (ref 0.0–1.2)
CO2: 22 mmol/L (ref 20–29)
Calcium: 9.2 mg/dL (ref 8.7–10.3)
Chloride: 106 mmol/L (ref 96–106)
Creatinine, Ser: 0.82 mg/dL (ref 0.57–1.00)
Globulin, Total: 2 g/dL (ref 1.5–4.5)
Glucose: 268 mg/dL — ABNORMAL HIGH (ref 70–99)
Potassium: 4.4 mmol/L (ref 3.5–5.2)
Sodium: 141 mmol/L (ref 134–144)
Total Protein: 6.1 g/dL (ref 6.0–8.5)
eGFR: 77 mL/min/{1.73_m2} (ref >59)

## 2022-09-30 LAB — LIPID PANEL
Chol/HDL Ratio: 2.5 ratio (ref 0.0–4.4)
Cholesterol, Total: 82 mg/dL — ABNORMAL LOW (ref 100–199)
HDL: 33 mg/dL — ABNORMAL LOW (ref >39)
LDL Chol Calc (NIH): 16 mg/dL (ref 0–99)
Triglycerides: 217 mg/dL — ABNORMAL HIGH (ref 0–149)
VLDL Cholesterol Cal: 33 mg/dL (ref 5–40)

## 2022-09-30 LAB — MICROALBUMIN / CREATININE URINE RATIO
Creatinine, Urine: 28.1 mg/dL
Microalb/Creat Ratio: 17 mg/g creat (ref 0–29)
Microalbumin, Urine: 4.8 ug/mL

## 2022-10-10 ENCOUNTER — Encounter (INDEPENDENT_AMBULATORY_CARE_PROVIDER_SITE_OTHER): Payer: Medicare Other | Admitting: Ophthalmology

## 2022-10-10 DIAGNOSIS — H43813 Vitreous degeneration, bilateral: Secondary | ICD-10-CM

## 2022-10-10 DIAGNOSIS — E113392 Type 2 diabetes mellitus with moderate nonproliferative diabetic retinopathy without macular edema, left eye: Secondary | ICD-10-CM

## 2022-10-10 DIAGNOSIS — E113311 Type 2 diabetes mellitus with moderate nonproliferative diabetic retinopathy with macular edema, right eye: Secondary | ICD-10-CM

## 2022-10-10 DIAGNOSIS — I10 Essential (primary) hypertension: Secondary | ICD-10-CM

## 2022-10-10 DIAGNOSIS — H35033 Hypertensive retinopathy, bilateral: Secondary | ICD-10-CM

## 2022-11-28 ENCOUNTER — Other Ambulatory Visit: Payer: Self-pay | Admitting: Family Medicine

## 2022-12-30 ENCOUNTER — Telehealth: Payer: Self-pay | Admitting: Family Medicine

## 2022-12-30 DIAGNOSIS — E1165 Type 2 diabetes mellitus with hyperglycemia: Secondary | ICD-10-CM

## 2022-12-30 DIAGNOSIS — I10 Essential (primary) hypertension: Secondary | ICD-10-CM

## 2022-12-30 DIAGNOSIS — E782 Mixed hyperlipidemia: Secondary | ICD-10-CM

## 2022-12-30 MED ORDER — METFORMIN HCL ER 500 MG PO TB24: 1000.0000 mg | ORAL_TABLET | Freq: Two times a day (BID) | ORAL | 0 refills | Status: DC

## 2022-12-30 MED ORDER — LISINOPRIL-HYDROCHLOROTHIAZIDE 20-12.5 MG PO TABS: 1.0000 | ORAL_TABLET | Freq: Every day | ORAL | 0 refills | Status: DC

## 2022-12-30 MED ORDER — ATENOLOL 50 MG PO TABS: 50.0000 mg | ORAL_TABLET | Freq: Every day | ORAL | 0 refills | Status: DC

## 2022-12-30 MED ORDER — GLIPIZIDE 10 MG PO TABS: 10.0000 mg | ORAL_TABLET | Freq: Two times a day (BID) | ORAL | 0 refills | Status: DC

## 2022-12-30 MED ORDER — ATORVASTATIN CALCIUM 20 MG PO TABS: 20.0000 mg | ORAL_TABLET | Freq: Every day | ORAL | 0 refills | Status: DC

## 2022-12-30 NOTE — Telephone Encounter (Signed)
Aware refill sent to pharmacy ?

## 2022-12-30 NOTE — Telephone Encounter (Signed)
  Prescription Request  12/30/2022  Is this a "Controlled Substance" medicine? no  Have you seen your PCP in the last 2 weeks? no  If YES, route message to pool  -  If NO, patient needs to be scheduled for appointment.  What is the name of the medication or equipment? Metformin, Glipizide, Atenolol, Lisinopril, Atorvastatin  Have you contacted your pharmacy to request a refill? yes   Which pharmacy would you like this sent to? [facility] [phone]   Patient notified that their request is being sent to the clinical staff for review and that they should receive a response within 2 business days.    Rakes' pt.

## 2023-03-30 ENCOUNTER — Encounter: Payer: Self-pay | Admitting: Family Medicine

## 2023-03-30 ENCOUNTER — Ambulatory Visit (INDEPENDENT_AMBULATORY_CARE_PROVIDER_SITE_OTHER): Payer: Medicare Other | Admitting: Family Medicine

## 2023-03-30 VITALS — BP 124/69 | HR 63 | Temp 97.0°F | Ht 61.0 in | Wt 127.8 lb

## 2023-03-30 DIAGNOSIS — E782 Mixed hyperlipidemia: Secondary | ICD-10-CM

## 2023-03-30 DIAGNOSIS — R6889 Other general symptoms and signs: Secondary | ICD-10-CM

## 2023-03-30 DIAGNOSIS — Z7984 Long term (current) use of oral hypoglycemic drugs: Secondary | ICD-10-CM

## 2023-03-30 DIAGNOSIS — E1169 Type 2 diabetes mellitus with other specified complication: Secondary | ICD-10-CM

## 2023-03-30 DIAGNOSIS — E785 Hyperlipidemia, unspecified: Secondary | ICD-10-CM

## 2023-03-30 DIAGNOSIS — E1159 Type 2 diabetes mellitus with other circulatory complications: Secondary | ICD-10-CM

## 2023-03-30 DIAGNOSIS — E1165 Type 2 diabetes mellitus with hyperglycemia: Secondary | ICD-10-CM | POA: Diagnosis not present

## 2023-03-30 DIAGNOSIS — I152 Hypertension secondary to endocrine disorders: Secondary | ICD-10-CM

## 2023-03-30 LAB — BAYER DCA HB A1C WAIVED: HB A1C (BAYER DCA - WAIVED): 8.2 % — ABNORMAL HIGH (ref 4.8–5.6)

## 2023-03-30 MED ORDER — LEVOCETIRIZINE DIHYDROCHLORIDE 5 MG PO TABS
5.0000 mg | ORAL_TABLET | Freq: Every evening | ORAL | 0 refills | Status: DC
Start: 2023-03-30 — End: 2023-06-30

## 2023-03-30 MED ORDER — ATORVASTATIN CALCIUM 20 MG PO TABS
20.0000 mg | ORAL_TABLET | Freq: Every day | ORAL | 0 refills | Status: DC
Start: 2023-03-30 — End: 2023-06-30

## 2023-03-30 MED ORDER — ATENOLOL 50 MG PO TABS
50.0000 mg | ORAL_TABLET | Freq: Every day | ORAL | 0 refills | Status: DC
Start: 2023-03-30 — End: 2023-06-30

## 2023-03-30 MED ORDER — GLIPIZIDE 10 MG PO TABS
10.0000 mg | ORAL_TABLET | Freq: Two times a day (BID) | ORAL | 0 refills | Status: DC
Start: 2023-03-30 — End: 2023-06-30

## 2023-03-30 MED ORDER — LISINOPRIL-HYDROCHLOROTHIAZIDE 20-12.5 MG PO TABS
1.0000 | ORAL_TABLET | Freq: Every day | ORAL | 0 refills | Status: DC
Start: 2023-03-30 — End: 2023-06-30

## 2023-03-30 MED ORDER — METFORMIN HCL ER 500 MG PO TB24
1000.0000 mg | ORAL_TABLET | Freq: Two times a day (BID) | ORAL | 0 refills | Status: DC
Start: 2023-03-30 — End: 2023-06-30

## 2023-03-30 NOTE — Patient Instructions (Signed)

## 2023-03-30 NOTE — Progress Notes (Signed)
Subjective:  Patient ID: Barbara Schroeder, female    DOB: [birth year], 72 y.o.   MRN: [id number]  Patient Care Team: Sonny Masters, FNP as PCP - General (Family Medicine) Danella Maiers, [facility] as Pharmacist (Family Medicine)   Chief Complaint:  Medical Management of Chronic Issues (6 month follow up )   HPI: Barbara Schroeder is a 72 y.o. female presenting on 03/30/2023 for Medical Management of Chronic Issues (6 month follow up )   1. Mixed hyperlipidemia associated with diabetes Compliant with medications - Yes Current medications - atorvastatin Side effects from medications - No Diet - general  Exercise - none   2. Controlled type 2 diabetes mellitus with hyperglycemia, without long-term current use of insulin (HCC) Currently on Farxiga, Metformin, and Glipizide. Does not follow a healthy diet. BS this morning was 150. No polyuria, polyphagia, or polydipsia.   3. Congestion of throat On antihistamine and does well.   4. Hypertension associated with diabetes (HCC) Complaint with meds - Yes Current Medications - atenolol, zestoretic  Checking BP at home - no Exercising Regularly - No Watching Salt intake - Yes Pertinent ROS:  Headache - No Fatigue - No Visual Disturbances - No Chest pain - No Dyspnea - No Palpitations - No LE edema - No They report good compliance with medications and can restate their regimen by memory. No medication side effects.  BP Readings from Last 3 Encounters:  03/30/23 124/69  09/29/22 117/64  04/12/22 107/73        Relevant past medical, surgical, family, and social history reviewed and updated as indicated.  Allergies and medications reviewed and updated. Data reviewed: Chart in Epic.   Past Medical History:  Diagnosis Date   Cataract    Diabetes mellitus without complication (HCC)    Hypertension    Osteopenia 04/28/2021    Past Surgical History:  Procedure Laterality Date   EYE SURGERY      Social History    Socioeconomic History   Marital status: Married    Spouse name: Not on file   Number of children: Not on file   Years of education: Not on file   Highest education level: Not on file  Occupational History   Occupation: Disabled  Tobacco Use   Smoking status: Former    Current packs/day: 0.00    Types: Cigarettes    Quit date: 1995    Years since quitting: 29.5   Smokeless tobacco: Never  Vaping Use   Vaping status: Never Used  Substance and Sexual Activity   Alcohol use: Not Currently   Drug use: Never   Sexual activity: Not on file  Other Topics Concern   Not on file  Social History Narrative   Not on file   Social Determinants of Health   Financial Resource Strain: Low Risk  (07/01/2022)   Overall Financial Resource Strain (CARDIA)    Difficulty of Paying Living Expenses: Not hard at all  Food Insecurity: No Food Insecurity (07/01/2022)   Hunger Vital Sign    Worried About Running Out of Food in the Last Year: Never true    Ran Out of Food in the Last Year: Never true  Transportation Needs: Unmet Transportation Needs (07/01/2022)   PRAPARE - Transportation    Lack of Transportation (Medical): Yes    Lack of Transportation (Non-Medical): Yes  Physical Activity: Inactive (07/01/2022)   Exercise Vital Sign    Days of Exercise per Week: 0 days    Minutes of  Exercise per Session: 0 min  Stress: No Stress Concern Present (07/01/2022)   [employer] of Occupational Health - Occupational Stress Questionnaire    Feeling of Stress : Not at all  Social Connections: Moderately Isolated (07/01/2022)   Social Connection and Isolation Panel [NHANES]    Frequency of Communication with Friends and Family: Twice a week    Frequency of Social Gatherings with Friends and Family: Once a week    Attends Religious Services: Never    Database administrator or Organizations: No    Attends Banker Meetings: Never    Marital Status: Married  Catering manager  Violence: Not At Risk (07/01/2022)   Humiliation, Afraid, Rape, and Kick questionnaire    Fear of Current or Ex-Partner: No    Emotionally Abused: No    Physically Abused: No    Sexually Abused: No    Outpatient Encounter Medications as of 03/30/2023  Medication Sig   Blood Glucose Monitoring Suppl (ACCU-CHEK GUIDE ME) w/Device KIT Test BS daily Dx E11.65   dapagliflozin propanediol (FARXIGA) 10 MG TABS tablet Take 1 tablet (10 mg total) by mouth daily before breakfast.   glucose blood (ACCU-CHEK GUIDE) test strip TEST BLOOD SUGAR DAILY E11.65   [DISCONTINUED] atenolol (TENORMIN) 50 MG tablet Take 1 tablet (50 mg total) by mouth daily.   [DISCONTINUED] atorvastatin (LIPITOR) 20 MG tablet Take 1 tablet (20 mg total) by mouth daily.   [DISCONTINUED] glipiZIDE (GLUCOTROL) 10 MG tablet Take 1 tablet (10 mg total) by mouth 2 (two) times daily.   [DISCONTINUED] levocetirizine (XYZAL) 5 MG tablet TAKE ONE TABLET BY MOUTH EVERY EVENING   [DISCONTINUED] lisinopril-hydrochlorothiazide (ZESTORETIC) 20-12.5 MG tablet Take 1 tablet by mouth daily.   [DISCONTINUED] metFORMIN (GLUCOPHAGE-XR) 500 MG 24 hr tablet Take 2 tablets (1,000 mg total) by mouth 2 (two) times daily.   atenolol (TENORMIN) 50 MG tablet Take 1 tablet (50 mg total) by mouth daily.   atorvastatin (LIPITOR) 20 MG tablet Take 1 tablet (20 mg total) by mouth daily.   glipiZIDE (GLUCOTROL) 10 MG tablet Take 1 tablet (10 mg total) by mouth 2 (two) times daily.   levocetirizine (XYZAL) 5 MG tablet Take 1 tablet (5 mg total) by mouth every evening.   lisinopril-hydrochlorothiazide (ZESTORETIC) 20-12.5 MG tablet Take 1 tablet by mouth daily.   metFORMIN (GLUCOPHAGE-XR) 500 MG 24 hr tablet Take 2 tablets (1,000 mg total) by mouth 2 (two) times daily.   No facility-administered encounter medications on file as of 03/30/2023.    No Known Allergies  Review of Systems  Unable to perform ROS: Patient nonverbal (ROS per husband)  Constitutional:   Negative for activity change, appetite change, chills, fatigue and fever.  HENT: Negative.    Eyes: Negative.   Respiratory:  Negative for cough, chest tightness and shortness of breath.   Cardiovascular:  Negative for chest pain, palpitations and leg swelling.  Gastrointestinal:  Negative for blood in stool, constipation, diarrhea, nausea and vomiting.  Endocrine: Negative.   Genitourinary:  Negative for dysuria, frequency and urgency.  Musculoskeletal:  Negative for arthralgias and myalgias.  Skin: Negative.   Allergic/Immunologic: Negative.   Neurological:  Negative for dizziness and headaches.  Hematological: Negative.   Psychiatric/Behavioral:  Negative for confusion, hallucinations, sleep disturbance and suicidal ideas.   All other systems reviewed and are negative.       Objective:  BP 124/69   Pulse 63   Temp (!) 97 F (36.1 C) (Temporal)   Ht 5\' 1"  (1.549  m)   Wt 127 lb 12.8 oz (58 kg)   SpO2 94%   BMI 24.15 kg/m    Wt Readings from Last 3 Encounters:  03/30/23 127 lb 12.8 oz (58 kg)  09/29/22 125 lb 6.4 oz (56.9 kg)  04/12/22 121 lb 3.2 oz (55 kg)    Physical Exam Vitals and nursing note reviewed.  Constitutional:      General: She is not in acute distress.    Appearance: Normal appearance. She is well-developed and well-groomed. She is not ill-appearing, toxic-appearing or diaphoretic.  HENT:     Head: Normocephalic and atraumatic.     Jaw: There is normal jaw occlusion.     Right Ear: Hearing normal.     Left Ear: Hearing normal.     Nose: Nose normal.     Mouth/Throat:     Lips: Pink.     Mouth: Mucous membranes are moist.     Pharynx: Oropharynx is clear. Uvula midline.  Eyes:     General: Lids are normal.     Extraocular Movements: Extraocular movements intact.     Conjunctiva/sclera: Conjunctivae normal.     Pupils: Pupils are equal, round, and reactive to light.  Neck:     Thyroid: No thyroid mass, thyromegaly or thyroid tenderness.      Vascular: No carotid bruit or JVD.     Trachea: Trachea and phonation normal.  Cardiovascular:     Rate and Rhythm: Normal rate and regular rhythm.     Chest Wall: PMI is not displaced.     Pulses: Normal pulses.     Heart sounds: Normal heart sounds. No murmur heard.    No friction rub. No gallop.  Pulmonary:     Effort: Pulmonary effort is normal. No respiratory distress.     Breath sounds: Normal breath sounds. No wheezing.  Abdominal:     General: Bowel sounds are normal. There is no distension or abdominal bruit.     Palpations: Abdomen is soft. There is no hepatomegaly or splenomegaly.     Tenderness: There is no abdominal tenderness. There is no right CVA tenderness or left CVA tenderness.     Hernia: No hernia is present.  Musculoskeletal:        General: Normal range of motion.     Cervical back: Normal range of motion and neck supple.     Right lower leg: No edema.     Left lower leg: No edema.  Lymphadenopathy:     Cervical: No cervical adenopathy.  Skin:    General: Skin is warm and dry.     Capillary Refill: Capillary refill takes less than 2 seconds.     Coloration: Skin is not cyanotic, jaundiced or pale.     Findings: No rash.  Neurological:     General: No focal deficit present.     Mental Status: She is alert and oriented to person, place, and time.     Sensory: Sensation is intact.     Motor: Motor function is intact.     Coordination: Coordination is intact.     Gait: Gait is intact.     Deep Tendon Reflexes: Reflexes are normal and symmetric.     Comments: Nonverbal   Psychiatric:        Attention and Perception: Attention and perception normal.        Mood and Affect: Mood and affect normal.        Speech: Speech normal.  Behavior: Behavior normal. Behavior is cooperative.        Thought Content: Thought content normal.        Cognition and Memory: Cognition and memory normal.        Judgment: Judgment normal.     Results for orders placed  or performed in visit on 09/29/22  Lipid panel  Result Value Ref Range   Cholesterol, Total 82 (L) 100 - 199 mg/dL   Triglycerides 259 (H) 0 - 149 mg/dL   HDL 33 (L) >56 mg/dL   VLDL Cholesterol Cal 33 5 - 40 mg/dL   LDL Chol Calc (NIH) 16 0 - 99 mg/dL   Chol/HDL Ratio 2.5 0.0 - 4.4 ratio  Bayer DCA Hb A1c Waived  Result Value Ref Range   HB A1C (BAYER DCA - WAIVED) 6.9 (H) 4.8 - 5.6 %  CMP14+EGFR  Result Value Ref Range   Glucose 268 (H) 70 - 99 mg/dL   BUN 17 8 - 27 mg/dL   Creatinine, Ser 3.87 0.57 - 1.00 mg/dL   eGFR 77 >56 EP/PIR/5.18   BUN/Creatinine Ratio 21 12 - 28   Sodium 141 134 - 144 mmol/L   Potassium 4.4 3.5 - 5.2 mmol/L   Chloride 106 96 - 106 mmol/L   CO2 22 20 - 29 mmol/L   Calcium 9.2 8.7 - 10.3 mg/dL   Total Protein 6.1 6.0 - 8.5 g/dL   Albumin 4.1 3.9 - 4.9 g/dL   Globulin, Total 2.0 1.5 - 4.5 g/dL   Albumin/Globulin Ratio 2.1 1.2 - 2.2   Bilirubin Total 0.3 0.0 - 1.2 mg/dL   Alkaline Phosphatase 47 44 - 121 IU/L   AST 20 0 - 40 IU/L   ALT 24 0 - 32 IU/L  Microalbumin / creatinine urine ratio  Result Value Ref Range   Creatinine, Urine 28.1 Not Estab. mg/dL   Microalbumin, Urine 4.8 Not Estab. ug/mL   Microalb/Creat Ratio 17 0 - 29 mg/g creat       Pertinent labs & imaging results that were available during my care of the patient were reviewed by me and considered in my medical decision making.  Assessment & Plan:  Malika was seen today for medical management of chronic issues.  Diagnoses and all orders for this visit:    Controlled type 2 diabetes mellitus with hyperglycemia, without long-term current use of insulin (HCC) A1C 8.2 today, aware to make dietary changes, will repeat in 3 months, if still elevated, will increase metformin or change to ozempic.  -     Lipid panel -     CMP14+EGFR -     Bayer DCA Hb A1c Waived -     Vitamin B12 -     glipiZIDE (GLUCOTROL) 10 MG tablet; Take 1 tablet (10 mg total) by mouth 2 (two) times daily. -      metFORMIN (GLUCOPHAGE-XR) 500 MG 24 hr tablet; Take 2 tablets (1,000 mg total) by mouth 2 (two) times daily.  Congestion of throat Does well on below, continue.  -     levocetirizine (XYZAL) 5 MG tablet; Take 1 tablet (5 mg total) by mouth every evening.  Hypertension associated with diabetes (HCC) BP well controlled. Changes were not made in regimen today. Goal BP is 130/80. Pt aware to report any persistent high or low readings. DASH diet and exercise encouraged. Exercise at least 150 minutes per week and increase as tolerated. Goal BMI > 25. Stress management encouraged. Avoid nicotine and tobacco product use. Avoid  excessive alcohol and NSAID's. Avoid more than 2000 mg of sodium daily. Medications as prescribed. Follow up as scheduled.  -     Lipid panel -     CMP14+EGFR -     atenolol (TENORMIN) 50 MG tablet; Take 1 tablet (50 mg total) by mouth daily. -     lisinopril-hydrochlorothiazide (ZESTORETIC) 20-12.5 MG tablet; Take 1 tablet by mouth daily.  Hyperlipidemia associated with type 2 diabetes mellitus (HCC) Diet encouraged - increase intake of fresh fruits and vegetables, increase intake of lean proteins. Bake, broil, or grill foods. Avoid fried, greasy, and fatty foods. Avoid fast foods. Increase intake of fiber-rich whole grains. Exercise encouraged - at least 150 minutes per week and advance as tolerated.  Goal BMI < 25. Continue medications as prescribed. Follow up in 3-6 months as discussed.  -     Lipid panel -     CMP14+EGFR -     atorvastatin (LIPITOR) 20 MG tablet; Take 1 tablet (20 mg total) by mouth daily.     Continue all other maintenance medications.  Follow up plan: Return in about 3 months (around 06/30/2023) for DM.   Continue healthy lifestyle choices, including diet (rich in fruits, vegetables, and lean proteins, and low in salt and simple carbohydrates) and exercise (at least 30 minutes of moderate physical activity daily).  Educational handout given for  DM  The above assessment and management plan was discussed with the patient. The patient verbalized understanding of and has agreed to the management plan. Patient is aware to call the clinic if they develop any new symptoms or if symptoms persist or worsen. Patient is aware when to return to the clinic for a follow-up visit. Patient educated on when it is appropriate to go to the emergency department.   Kari Baars, FNP-C [facility] [phone]

## 2023-03-31 ENCOUNTER — Other Ambulatory Visit: Payer: Self-pay | Admitting: Family Medicine

## 2023-03-31 DIAGNOSIS — E1169 Type 2 diabetes mellitus with other specified complication: Secondary | ICD-10-CM

## 2023-03-31 DIAGNOSIS — E1165 Type 2 diabetes mellitus with hyperglycemia: Secondary | ICD-10-CM

## 2023-03-31 DIAGNOSIS — E1159 Type 2 diabetes mellitus with other circulatory complications: Secondary | ICD-10-CM

## 2023-03-31 LAB — CMP14+EGFR
ALT: 20 IU/L (ref 0–32)
AST: 16 IU/L (ref 0–40)
Albumin: 4.3 g/dL (ref 3.9–4.9)
Alkaline Phosphatase: 49 IU/L (ref 44–121)
BUN/Creatinine Ratio: 20 (ref 12–28)
BUN: 17 mg/dL (ref 8–27)
Bilirubin Total: 0.6 mg/dL (ref 0.0–1.2)
CO2: 20 mmol/L (ref 20–29)
Calcium: 9.7 mg/dL (ref 8.7–10.3)
Chloride: 103 mmol/L (ref 96–106)
Creatinine, Ser: 0.83 mg/dL (ref 0.57–1.00)
Globulin, Total: 1.9 g/dL (ref 1.5–4.5)
Glucose: 206 mg/dL — ABNORMAL HIGH (ref 70–99)
Potassium: 4.7 mmol/L (ref 3.5–5.2)
Sodium: 139 mmol/L (ref 134–144)
Total Protein: 6.2 g/dL (ref 6.0–8.5)
eGFR: 76 mL/min/{1.73_m2} (ref >59)

## 2023-03-31 LAB — LIPID PANEL
Chol/HDL Ratio: 2.4 ratio (ref 0.0–4.4)
Cholesterol, Total: 81 mg/dL — ABNORMAL LOW (ref 100–199)
HDL: 34 mg/dL — ABNORMAL LOW (ref >39)
LDL Chol Calc (NIH): 14 mg/dL (ref 0–99)
Triglycerides: 213 mg/dL — ABNORMAL HIGH (ref 0–149)
VLDL Cholesterol Cal: 33 mg/dL (ref 5–40)

## 2023-03-31 LAB — VITAMIN B12: Vitamin B-12: 459 pg/mL (ref 232–1245)

## 2023-04-06 ENCOUNTER — Other Ambulatory Visit: Payer: Self-pay | Admitting: Pharmacist

## 2023-04-06 DIAGNOSIS — E1165 Type 2 diabetes mellitus with hyperglycemia: Secondary | ICD-10-CM

## 2023-04-06 MED ORDER — DAPAGLIFLOZIN PROPANEDIOL 10 MG PO TABS
10.0000 mg | ORAL_TABLET | Freq: Every day | ORAL | 5 refills | Status: DC
Start: 2023-04-06 — End: 2023-11-02

## 2023-04-06 NOTE — Progress Notes (Signed)
   04/06/2023 Name: Barbara Schroeder MRN: [id number] DOB: 1950/10/19  Chief Complaint  Patient presents with   Diabetes    Refills sent for dapagliflozin Marcelline Deist) to [facility] pharmacy on behalf of AZ&me patient assistance. Patient is stable on current therapy and is enrolled in AZ&me PAP until 09/19/2023.  A1c increased to 8.8% from 6.9% so will f/u with patient in 2-3 weeks.  She Is currently on metformin, dapagliflozin & glipizide.  GFR stable.    Kieth Brightly, PharmD, BCACP Clinical Pharmacist, [facility]

## 2023-04-20 ENCOUNTER — Encounter: Payer: Self-pay | Admitting: Pharmacist

## 2023-04-20 ENCOUNTER — Ambulatory Visit (INDEPENDENT_AMBULATORY_CARE_PROVIDER_SITE_OTHER): Payer: Medicare Other | Admitting: Pharmacist

## 2023-04-20 DIAGNOSIS — E1165 Type 2 diabetes mellitus with hyperglycemia: Secondary | ICD-10-CM

## 2023-04-20 MED ORDER — ACCU-CHEK GUIDE VI STRP
ORAL_STRIP | 9 refills | Status: DC
Start: 2023-04-20 — End: 2024-05-03

## 2023-04-20 NOTE — Progress Notes (Signed)
04/20/2023 Name: Barbara Schroeder MRN: [id number] DOB: Oct 31, 1950  Chief Complaint  Patient presents with   Diabetes Management Plan    Barbara Schroeder is a 72 y.o. year old female who presented for a telephone visit.   They were referred to the pharmacist by their PCP for assistance in managing diabetes.  Her blood sugar was elevated the past 3 months, but now patient's spouse reports it is now within goal/target range.  I connected with  Barbara Schroeder (caregiver of Barbara Schroeder who is non-verbal) on 04/20/23 by telephone and verified that I am speaking with the correct person using two identifiers. I discussed the limitations of evaluation and management by telemedicine. The patient expressed understanding and agreed to proceed.  Patient was located in her home and PharmD in PCP office during this visit.    Subjective:  Care Team: Primary Care Provider: Sonny Masters, FNP   Medication Access/Adherence  Current Pharmacy:  [facility] [id number] - [place], [place] - [address] [place] [address] Phone: [phone] Fax: [phone]  [facility] - [place], [place] - [address] [place] [address] Phone: [phone] Fax: [phone]  [id number] - [place], [place] - [address] [place] [address] Phone: [phone] Fax: [phone]  [facility] [address], [place] - [address] [place] [address] Phone: [phone] Fax: [phone]   Patient reports affordability concerns with their medications: Yes  Patient reports access/transportation concerns to their pharmacy: No  Patient reports adherence concerns with their medications:  No     Diabetes:  Current medications: farxiga, metformin, glipizide Medications tried in the past: n/a  Current glucose readings: FBG<135 Using accuchek GUIDE meter; testing 1 time  daily  -Patient is testing blood sugar 6 times daily -Patient is injecting insulin 4 or more times daily -He would greatly benefit from a continuous glucose monitoring system (I.e. libre or dexcom)  Patient denies hypoglycemic s/sx including dizziness, shakiness, sweating. Patient denies hyperglycemic symptoms including polyuria, polydipsia, polyphagia, nocturia, neuropathy, blurred vision.  Current physical activity: limited  Current medication access support: az&me patient assistance (farxiga)   Objective:  Lab Results  Component Value Date   HGBA1C 8.2 (H) 03/30/2023    Lab Results  Component Value Date   CREATININE 0.83 03/30/2023   BUN 17 03/30/2023   NA 139 03/30/2023   K 4.7 03/30/2023   CL 103 03/30/2023   CO2 20 03/30/2023    Lab Results  Component Value Date   CHOL 81 (L) 03/30/2023   HDL 34 (L) 03/30/2023   LDLCALC 14 03/30/2023   TRIG 213 (H) 03/30/2023   CHOLHDL 2.4 03/30/2023    Medications Reviewed Today     Reviewed by Barbara Schroeder, [facility] (Pharmacist) on 04/20/23 at 1525  Med List Status: <None>   Medication Order Taking? Sig Documenting Provider Last Dose Status Informant  atenolol (TENORMIN) 50 MG tablet [id number]  Take 1 tablet (50 mg total) by mouth daily. Barbara Masters, FNP  Active   atorvastatin (LIPITOR) 20 MG tablet [id number]  Take 1 tablet (20 mg total) by mouth daily. Barbara Masters, FNP  Active   Blood Glucose Monitoring Suppl (ACCU-CHEK GUIDE ME) w/Device KIT [id number] No Test BS daily Dx E11.65 Barbara Masters, FNP Taking Active   dapagliflozin propanediol (FARXIGA) 10 MG TABS tablet [id number]  Take 1 tablet (10 mg total) by  mouth daily before breakfast. Barbara Masters, FNP  Active            Med Note Barbara Schroeder, Barbara Schroeder Apr 06, 2023 11:03 AM) Via AZ&me patient assistance program   glipiZIDE (GLUCOTROL) 10 MG tablet [id number]  Take 1 tablet (10 mg total) by mouth 2 (two) times daily. Barbara Masters, FNP  Active   glucose blood  (ACCU-CHEK GUIDE) test strip [id number]  Use as instructed to test blood sugar daily. DX:E11.65 Barbara Masters, FNP  Active   levocetirizine (XYZAL) 5 MG tablet [id number]  Take 1 tablet (5 mg total) by mouth every evening. Barbara Masters, FNP  Active   lisinopril-hydrochlorothiazide (ZESTORETIC) 20-12.5 MG tablet [id number]  Take 1 tablet by mouth daily. Barbara Masters, FNP  Active   metFORMIN (GLUCOPHAGE-XR) 500 MG 24 hr tablet [id number]  Take 2 tablets (1,000 mg total) by mouth 2 (two) times daily. Barbara Masters, FNP  Active             Assessment/Plan:   Diabetes: - Currently uncontrolled, but improved per patient caregiver report - Reviewed long term cardiovascular and renal outcomes of uncontrolled blood sugar - Reviewed goal A1c, goal fasting, and goal 2 hour post prandial glucose - Recommend to continue current meds; blood sugar has improved; no changes made today - Refill for test strips sent in to pharmacy per patient request  - Recommend to check glucose daily (fasting) or if symptomatic   Follow Up Plan: 3-6 months with pharmd  Kieth Brightly, PharmD, BCACP Clinical Pharmacist, [facility]

## 2023-05-03 ENCOUNTER — Encounter: Payer: Self-pay | Admitting: Family Medicine

## 2023-06-30 ENCOUNTER — Ambulatory Visit (INDEPENDENT_AMBULATORY_CARE_PROVIDER_SITE_OTHER): Payer: Medicare Other | Admitting: Family Medicine

## 2023-06-30 ENCOUNTER — Encounter: Payer: Self-pay | Admitting: Family Medicine

## 2023-06-30 ENCOUNTER — Other Ambulatory Visit: Payer: Self-pay | Admitting: Family Medicine

## 2023-06-30 VITALS — BP 127/72 | HR 76 | Temp 97.0°F | Ht 61.0 in | Wt 126.6 lb

## 2023-06-30 DIAGNOSIS — E1159 Type 2 diabetes mellitus with other circulatory complications: Secondary | ICD-10-CM

## 2023-06-30 DIAGNOSIS — I152 Hypertension secondary to endocrine disorders: Secondary | ICD-10-CM

## 2023-06-30 DIAGNOSIS — Z7984 Long term (current) use of oral hypoglycemic drugs: Secondary | ICD-10-CM

## 2023-06-30 DIAGNOSIS — E1169 Type 2 diabetes mellitus with other specified complication: Secondary | ICD-10-CM

## 2023-06-30 DIAGNOSIS — E1165 Type 2 diabetes mellitus with hyperglycemia: Secondary | ICD-10-CM

## 2023-06-30 DIAGNOSIS — R6889 Other general symptoms and signs: Secondary | ICD-10-CM | POA: Diagnosis not present

## 2023-06-30 DIAGNOSIS — E785 Hyperlipidemia, unspecified: Secondary | ICD-10-CM

## 2023-06-30 LAB — BAYER DCA HB A1C WAIVED: HB A1C (BAYER DCA - WAIVED): 6.8 % — ABNORMAL HIGH (ref 4.8–5.6)

## 2023-06-30 MED ORDER — LEVOCETIRIZINE DIHYDROCHLORIDE 5 MG PO TABS
5.0000 mg | ORAL_TABLET | Freq: Every evening | ORAL | 1 refills | Status: AC
Start: 2023-06-30 — End: ?

## 2023-06-30 NOTE — Progress Notes (Signed)
Subjective:  Patient ID: Barbara Schroeder, female    DOB: [birth year], 72 y.o.   MRN: [id number]  Patient Care Team: Sonny Masters, FNP as PCP - General (Family Medicine) Danella Maiers, [facility] as Pharmacist (Family Medicine)   Chief Complaint:  Diabetes (3 month follow up )   HPI: Barbara Schroeder is a 72 y.o. female presenting on 06/30/2023 for Diabetes (3 month follow up )  Discussed the use of AI scribe software for clinical note transcription with the patient, who gave verbal consent to proceed.  History of Present Illness   The patient, with a history of diabetes, has been managing well with no recent changes in medication. The patient's blood sugar levels have been monitored at home, with a recent reading of 170. There have been no changes in urinary or bowel habits. No weakness, confusion, polyuria, polyphagia, or polydipsia. Her blood pressure has been well controlled. No chest pain, leg swelling, headaches, or dizziness. She is taking her statin medication and tolerating well.   The patient has been taking Xyzal or its equivalent, levocetirizine, regularly for congestion.  The patient had a recent eye exam with Dr. Ashley Royalty, who recommended a retinal injection for safety, but the patient declined. The patient's vision seems to have improved after cataract removal, although it is sometimes hard to tell.         Relevant past medical, surgical, family, and social history reviewed and updated as indicated.  Allergies and medications reviewed and updated. Data reviewed: Chart in Epic.   Past Medical History:  Diagnosis Date   Cataract    Diabetes mellitus without complication (HCC)    Hypertension    Osteopenia 04/28/2021    Past Surgical History:  Procedure Laterality Date   EYE SURGERY      Social History   Socioeconomic History   Marital status: Married    Spouse name: Not on file   Number of children: Not on file   Years of education: Not on file    Highest education level: Not on file  Occupational History   Occupation: Disabled  Tobacco Use   Smoking status: Former    Current packs/day: 0.00    Types: Cigarettes    Quit date: 1995    Years since quitting: 29.7   Smokeless tobacco: Never  Vaping Use   Vaping status: Never Used  Substance and Sexual Activity   Alcohol use: Not Currently   Drug use: Never   Sexual activity: Not on file  Other Topics Concern   Not on file  Social History Narrative   Not on file   Social Determinants of Health   Financial Resource Strain: Low Risk  (07/01/2022)   Overall Financial Resource Strain (CARDIA)    Difficulty of Paying Living Expenses: Not hard at all  Food Insecurity: No Food Insecurity (07/01/2022)   Hunger Vital Sign    Worried About Running Out of Food in the Last Year: Never true    Ran Out of Food in the Last Year: Never true  Transportation Needs: Unmet Transportation Needs (07/01/2022)   PRAPARE - Transportation    Lack of Transportation (Medical): Yes    Lack of Transportation (Non-Medical): Yes  Physical Activity: Inactive (07/01/2022)   Exercise Vital Sign    Days of Exercise per Week: 0 days    Minutes of Exercise per Session: 0 min  Stress: No Stress Concern Present (07/01/2022)   [employer] of Occupational Health - Occupational Stress Questionnaire  Feeling of Stress : Not at all  Social Connections: Moderately Isolated (07/01/2022)   Social Connection and Isolation Panel [NHANES]    Frequency of Communication with Friends and Family: Twice a week    Frequency of Social Gatherings with Friends and Family: Once a week    Attends Religious Services: Never    Database administrator or Organizations: No    Attends Banker Meetings: Never    Marital Status: Married  Catering manager Violence: Not At Risk (07/01/2022)   Humiliation, Afraid, Rape, and Kick questionnaire    Fear of Current or Ex-Partner: No    Emotionally Abused: No     Physically Abused: No    Sexually Abused: No    Outpatient Encounter Medications as of 06/30/2023  Medication Sig   atenolol (TENORMIN) 50 MG tablet TAKE 1 TABLET BY MOUTH DAILY   atorvastatin (LIPITOR) 20 MG tablet TAKE 1 TABLET BY MOUTH DAILY   Blood Glucose Monitoring Suppl (ACCU-CHEK GUIDE ME) w/Device KIT Test BS daily Dx E11.65   dapagliflozin propanediol (FARXIGA) 10 MG TABS tablet Take 1 tablet (10 mg total) by mouth daily before breakfast.   glipiZIDE (GLUCOTROL) 10 MG tablet TAKE 1 TABLET BY MOUTH 2 TIMES A DAY   glucose blood (ACCU-CHEK GUIDE) test strip Use as instructed to test blood sugar daily. DX:E11.65   lisinopril-hydrochlorothiazide (ZESTORETIC) 20-12.5 MG tablet TAKE 1 TABLET BY MOUTH DAILY   metFORMIN (GLUCOPHAGE-XR) 500 MG 24 hr tablet TAKE 2 TABLETS BY MOUTH TWICE A DAY   [DISCONTINUED] levocetirizine (XYZAL) 5 MG tablet Take 1 tablet (5 mg total) by mouth every evening.   levocetirizine (XYZAL) 5 MG tablet Take 1 tablet (5 mg total) by mouth every evening.   No facility-administered encounter medications on file as of 06/30/2023.    No Known Allergies  ROS per HPI, otherwise unremarkable       Objective:  BP 127/72   Pulse 76   Temp (!) 97 F (36.1 C) (Temporal)   Ht 5\' 1"  (1.549 m)   Wt 126 lb 9.6 oz (57.4 kg)   SpO2 96%   BMI 23.92 kg/m    Wt Readings from Last 3 Encounters:  06/30/23 126 lb 9.6 oz (57.4 kg)  03/30/23 127 lb 12.8 oz (58 kg)  09/29/22 125 lb 6.4 oz (56.9 kg)    Physical Exam Vitals and nursing note reviewed.  Constitutional:      General: She is not in acute distress.    Appearance: Normal appearance. She is not ill-appearing, toxic-appearing or diaphoretic.  HENT:     Head: Normocephalic and atraumatic.     Mouth/Throat:     Mouth: Mucous membranes are moist.     Pharynx: Oropharynx is clear.  Eyes:     Conjunctiva/sclera: Conjunctivae normal.     Pupils: Pupils are equal, round, and reactive to light.  Cardiovascular:      Rate and Rhythm: Normal rate and regular rhythm.     Heart sounds: Normal heart sounds.  Pulmonary:     Effort: Pulmonary effort is normal.     Breath sounds: Normal breath sounds.  Skin:    General: Skin is warm and dry.     Capillary Refill: Capillary refill takes less than 2 seconds.  Neurological:     General: No focal deficit present.     Mental Status: She is alert. Mental status is at baseline.     Comments: Nonverbal  Psychiatric:        Mood  and Affect: Mood normal.     Results for orders placed or performed in visit on 03/30/23  Lipid panel  Result Value Ref Range   Cholesterol, Total 81 (L) 100 - 199 mg/dL   Triglycerides 440 (H) 0 - 149 mg/dL   HDL 34 (L) >34 mg/dL   VLDL Cholesterol Cal 33 5 - 40 mg/dL   LDL Chol Calc (NIH) 14 0 - 99 mg/dL   Chol/HDL Ratio 2.4 0.0 - 4.4 ratio  CMP14+EGFR  Result Value Ref Range   Glucose 206 (H) 70 - 99 mg/dL   BUN 17 8 - 27 mg/dL   Creatinine, Ser 7.42 0.57 - 1.00 mg/dL   eGFR 76 >59 DG/LOV/5.64   BUN/Creatinine Ratio 20 12 - 28   Sodium 139 134 - 144 mmol/L   Potassium 4.7 3.5 - 5.2 mmol/L   Chloride 103 96 - 106 mmol/L   CO2 20 20 - 29 mmol/L   Calcium 9.7 8.7 - 10.3 mg/dL   Total Protein 6.2 6.0 - 8.5 g/dL   Albumin 4.3 3.9 - 4.9 g/dL   Globulin, Total 1.9 1.5 - 4.5 g/dL   Bilirubin Total 0.6 0.0 - 1.2 mg/dL   Alkaline Phosphatase 49 44 - 121 IU/L   AST 16 0 - 40 IU/L   ALT 20 0 - 32 IU/L  Bayer DCA Hb A1c Waived  Result Value Ref Range   HB A1C (BAYER DCA - WAIVED) 8.2 (H) 4.8 - 5.6 %  Vitamin B12  Result Value Ref Range   Vitamin B-12 459 232 - 1,245 pg/mL       Pertinent labs & imaging results that were available during my care of the patient were reviewed by me and considered in my medical decision making.  Assessment & Plan:  Sami was seen today for diabetes.  Assessment and Plan    Diabetes Mellitus Blood glucose well controlled with Marcelline Deist, recent home blood glucose 170, A1c 6.8. -Continue  Farxiga as prescribed. -Check A1c in 3 months.  Allergic Rhinitis Managed with Xyzal (levocetirizine). -Continue Xyzal as prescribed.  Eye Health Recent cataract surgery improved vision significantly. Declined retinal injection. -Continue regular eye exams as recommended by ophthalmologist.  General Health Maintenance / Followup Plans -Ensure adequate support system in place for patient care. -Continue regular follow-up appointments. Next visit in 3 months.      Diagnoses and all orders for this visit:  Hyperlipidemia associated with type 2 diabetes mellitus (HCC) -     CMP14+EGFR -     Lipid panel -     Bayer DCA Hb A1c Waived  Congestion of throat -     levocetirizine (XYZAL) 5 MG tablet; Take 1 tablet (5 mg total) by mouth every evening.  Controlled type 2 diabetes mellitus with hyperglycemia, without long-term current use of insulin (HCC) -     CMP14+EGFR -     Lipid panel -     Bayer DCA Hb A1c Waived  Hypertension associated with diabetes (HCC) -     CMP14+EGFR -     Lipid panel -     Bayer DCA Hb A1c Waived     Continue all other maintenance medications.  Follow up plan: Return in about 3 months (around 09/30/2023), or if symptoms worsen or fail to improve, for DM.   Continue healthy lifestyle choices, including diet (rich in fruits, vegetables, and lean proteins, and low in salt and simple carbohydrates) and exercise (at least 30 minutes of moderate physical activity daily).  Educational handout given for DM  The above assessment and management plan was discussed with the patient. The patient verbalized understanding of and has agreed to the management plan. Patient is aware to call the clinic if they develop any new symptoms or if symptoms persist or worsen. Patient is aware when to return to the clinic for a follow-up visit. Patient educated on when it is appropriate to go to the emergency department.   Kari Baars, FNP-C [facility] [phone]

## 2023-07-01 LAB — CMP14+EGFR
ALT: 28 [IU]/L (ref 0–32)
AST: 20 [IU]/L (ref 0–40)
Albumin: 3.9 g/dL (ref 3.9–4.9)
Alkaline Phosphatase: 57 [IU]/L (ref 44–121)
BUN/Creatinine Ratio: 20 (ref 12–28)
BUN: 15 mg/dL (ref 8–27)
Bilirubin Total: 0.5 mg/dL (ref 0.0–1.2)
CO2: 21 mmol/L (ref 20–29)
Calcium: 9.4 mg/dL (ref 8.7–10.3)
Chloride: 106 mmol/L (ref 96–106)
Creatinine, Ser: 0.76 mg/dL (ref 0.57–1.00)
Globulin, Total: 2.3 g/dL (ref 1.5–4.5)
Glucose: 237 mg/dL — ABNORMAL HIGH (ref 70–99)
Potassium: 4.6 mmol/L (ref 3.5–5.2)
Sodium: 142 mmol/L (ref 134–144)
Total Protein: 6.2 g/dL (ref 6.0–8.5)
eGFR: 85 mL/min/{1.73_m2} (ref >59)

## 2023-07-01 LAB — LIPID PANEL
Chol/HDL Ratio: 2.9 {ratio} (ref 0.0–4.4)
Cholesterol, Total: 92 mg/dL — ABNORMAL LOW (ref 100–199)
HDL: 32 mg/dL — ABNORMAL LOW (ref >39)
LDL Chol Calc (NIH): 27 mg/dL (ref 0–99)
Triglycerides: 211 mg/dL — ABNORMAL HIGH (ref 0–149)
VLDL Cholesterol Cal: 33 mg/dL (ref 5–40)

## 2023-07-26 ENCOUNTER — Encounter: Payer: Self-pay | Admitting: Family Medicine

## 2023-09-08 ENCOUNTER — Telehealth: Payer: Self-pay | Admitting: Family Medicine

## 2023-10-02 ENCOUNTER — Other Ambulatory Visit: Payer: Self-pay | Admitting: Family Medicine

## 2023-10-02 DIAGNOSIS — E1159 Type 2 diabetes mellitus with other circulatory complications: Secondary | ICD-10-CM

## 2023-10-02 DIAGNOSIS — E1165 Type 2 diabetes mellitus with hyperglycemia: Secondary | ICD-10-CM

## 2023-10-02 DIAGNOSIS — E1169 Type 2 diabetes mellitus with other specified complication: Secondary | ICD-10-CM

## 2023-10-27 ENCOUNTER — Other Ambulatory Visit: Payer: Self-pay | Admitting: Family Medicine

## 2023-10-27 DIAGNOSIS — E1159 Type 2 diabetes mellitus with other circulatory complications: Secondary | ICD-10-CM

## 2023-10-27 DIAGNOSIS — E1169 Type 2 diabetes mellitus with other specified complication: Secondary | ICD-10-CM

## 2023-10-27 DIAGNOSIS — E1165 Type 2 diabetes mellitus with hyperglycemia: Secondary | ICD-10-CM

## 2023-10-27 NOTE — Telephone Encounter (Signed)
 Left message for pt to call back to schedule appt

## 2023-10-27 NOTE — Telephone Encounter (Signed)
 Rakes pt NTBS 30-d given 10/02/23 for 3 mos FU

## 2023-10-30 ENCOUNTER — Other Ambulatory Visit: Payer: Self-pay | Admitting: Family Medicine

## 2023-10-30 DIAGNOSIS — E1165 Type 2 diabetes mellitus with hyperglycemia: Secondary | ICD-10-CM

## 2023-10-30 DIAGNOSIS — I152 Hypertension secondary to endocrine disorders: Secondary | ICD-10-CM

## 2023-10-30 DIAGNOSIS — E1169 Type 2 diabetes mellitus with other specified complication: Secondary | ICD-10-CM

## 2023-10-31 NOTE — Patient Instructions (Incomplete)
Our records indicate that you are due for your annual mammogram/breast imaging. While there is no way to prevent breast cancer, early detection provides the best opportunity for curing it. For women over the age of 39, the American Cancer Society recommends a yearly clinical breast exam and a yearly mammogram. These practices have saved thousands of lives. We need your help to ensure that you are receiving optimal medical care. Please call the imaging location that has done you previous mammograms. Please remember to list [place] as your primary care. This helps make sure we receive a report and can update your chart.  Below is the contact information for several local breast imaging centers. You may call the location that works best for you, and they will be happy to assistance in making you an appointment. You do not need an order for a regular screening mammogram. However, if you are having any problems or concerns with you breast area, please let your primary care provider know, and appropriate orders will be placed. Please let our office know if you have any questions or concerns. Or if you need information for another imaging center not on this list or outside of the area. We are commented to working with you on your health care journey.   The mobile unit/bus ([facility]) - they come twice a month to our location.  These appointments can be made through our office or by call The Breast Center  [facility]  [address] [place], [place] [address] Phone [phone]  [facility]  [address] [place], [place] [address] [phone]  [facility] ([facility] of [facility])  [phone] [address], [place] [address] [phone]  [facility] - [facility]  [address] [place] [place] [address] [phone]  [facility] - [facility]  [address] [place] [address] [phone]  [facility] Mammography in [place]  [address] [place], [place] [address] [phone]  [facility] [address] [place], [place] [address] [phone]  [facility] [address] [place], [place] [address] [phone]  [facility] [place], [place] [address] [phone]

## 2023-11-02 ENCOUNTER — Ambulatory Visit: Payer: Medicare Other | Admitting: Family Medicine

## 2023-11-02 ENCOUNTER — Encounter: Payer: Self-pay | Admitting: Family Medicine

## 2023-11-02 VITALS — BP 127/74 | HR 68 | Temp 95.2°F | Ht 61.0 in | Wt 125.6 lb

## 2023-11-02 DIAGNOSIS — E785 Hyperlipidemia, unspecified: Secondary | ICD-10-CM

## 2023-11-02 DIAGNOSIS — E119 Type 2 diabetes mellitus without complications: Secondary | ICD-10-CM | POA: Insufficient documentation

## 2023-11-02 DIAGNOSIS — R4701 Aphasia: Secondary | ICD-10-CM

## 2023-11-02 DIAGNOSIS — E1159 Type 2 diabetes mellitus with other circulatory complications: Secondary | ICD-10-CM

## 2023-11-02 DIAGNOSIS — I152 Hypertension secondary to endocrine disorders: Secondary | ICD-10-CM

## 2023-11-02 DIAGNOSIS — E1169 Type 2 diabetes mellitus with other specified complication: Secondary | ICD-10-CM | POA: Diagnosis not present

## 2023-11-02 DIAGNOSIS — Z7984 Long term (current) use of oral hypoglycemic drugs: Secondary | ICD-10-CM

## 2023-11-02 LAB — BAYER DCA HB A1C WAIVED: HB A1C (BAYER DCA - WAIVED): 10.9 % — ABNORMAL HIGH (ref 4.8–5.6)

## 2023-11-02 MED ORDER — ATORVASTATIN CALCIUM 20 MG PO TABS: 20.0000 mg | ORAL_TABLET | Freq: Every day | ORAL | 3 refills | Status: AC

## 2023-11-02 MED ORDER — LISINOPRIL-HYDROCHLOROTHIAZIDE 20-12.5 MG PO TABS
1.0000 | ORAL_TABLET | Freq: Every day | ORAL | 3 refills | Status: AC
Start: 2023-11-02 — End: ?

## 2023-11-02 MED ORDER — DAPAGLIFLOZIN PROPANEDIOL 10 MG PO TABS: 10.0000 mg | ORAL_TABLET | Freq: Every day | ORAL | 5 refills | Status: DC

## 2023-11-02 MED ORDER — ATENOLOL 50 MG PO TABS: 50.0000 mg | ORAL_TABLET | Freq: Every day | ORAL | 3 refills | Status: AC

## 2023-11-02 NOTE — Patient Instructions (Addendum)

## 2023-11-02 NOTE — Progress Notes (Signed)
Subjective:  Patient ID: Barbara Schroeder, female    DOB: 1950-12-30, 73 y.o.   MRN: [id number]  Patient Care Team: Sonny Masters, FNP as PCP - General (Family Medicine) Danella Maiers, [facility] as Pharmacist (Family Medicine)   Chief Complaint:  Diabetes (3 month follow up)   HPI: Barbara Schroeder is a 73 y.o. female presenting on 11/02/2023 for Diabetes (3 month follow up)   Discussed the use of AI scribe software for clinical note transcription with the patient, who gave verbal consent to proceed.  History of Present Illness   Barbara Schroeder is a 73 year old female with diabetes who presents for a follow-up visit.  She has a history of diabetes, currently managed with Farxiga, glipizide, and metformin. Her blood sugar levels have been consistently elevated around 220 mg/dL, and her W0J has increased to 10.9% from a previous 6.8%. She reports no symptoms of increased urination, hunger, or thirst. Her diet includes bananas and [place] fries, which may be contributing to her elevated blood sugar levels.  She is on Zestoretic and atenolol for hypertension management and reports no issues with these medications, including headaches, chest pain, or leg swelling. Her feet have intact sensation.  She is on atorvastatin for hyperlipidemia management and denies muscle aches, indicating good tolerance to the medication.  Her medications are filled at [facility] in [place], except for [place], which is delivered through [facility]. She is no longer eligible for the extra help program due to settling her estate.          Relevant past medical, surgical, family, and social history reviewed and updated as indicated.  Allergies and medications reviewed and updated. Data reviewed: Chart in Epic.   Past Medical History:  Diagnosis Date   Cataract    Diabetes mellitus without complication (HCC)    Hypertension    Osteopenia 04/28/2021    Past Surgical History:  Procedure Laterality  Date   EYE SURGERY      Social History   Socioeconomic History   Marital status: Married    Spouse name: Not on file   Number of children: Not on file   Years of education: Not on file   Highest education level: Not on file  Occupational History   Occupation: Disabled  Tobacco Use   Smoking status: Former    Current packs/day: 0.00    Types: Cigarettes    Quit date: 1995    Years since quitting: 30.1   Smokeless tobacco: Never  Vaping Use   Vaping status: Never Used  Substance and Sexual Activity   Alcohol use: Not Currently   Drug use: Never   Sexual activity: Not on file  Other Topics Concern   Not on file  Social History Narrative   Not on file   Social Drivers of Health   Financial Resource Strain: Low Risk  (07/01/2022)   Overall Financial Resource Strain (CARDIA)    Difficulty of Paying Living Expenses: Not hard at all  Food Insecurity: No Food Insecurity (07/01/2022)   Hunger Vital Sign    Worried About Running Out of Food in the Last Year: Never true    Ran Out of Food in the Last Year: Never true  Transportation Needs: Unmet Transportation Needs (07/01/2022)   PRAPARE - Transportation    Lack of Transportation (Medical): Yes    Lack of Transportation (Non-Medical): Yes  Physical Activity: Inactive (07/01/2022)   Exercise Vital Sign    Days of Exercise per Week: 0 days  Minutes of Exercise per Session: 0 min  Stress: No Stress Concern Present (07/01/2022)   [employer] of Occupational Health - Occupational Stress Questionnaire    Feeling of Stress : Not at all  Social Connections: Moderately Isolated (07/01/2022)   Social Connection and Isolation Panel [NHANES]    Frequency of Communication with Friends and Family: Twice a week    Frequency of Social Gatherings with Friends and Family: Once a week    Attends Religious Services: Never    Database administrator or Organizations: No    Attends Banker Meetings: Never    Marital  Status: Married  Catering manager Violence: Not At Risk (07/01/2022)   Humiliation, Afraid, Rape, and Kick questionnaire    Fear of Current or Ex-Partner: No    Emotionally Abused: No    Physically Abused: No    Sexually Abused: No    Outpatient Encounter Medications as of 11/02/2023  Medication Sig   Blood Glucose Monitoring Suppl (ACCU-CHEK GUIDE ME) w/Device KIT Test BS daily Dx E11.65   glipiZIDE (GLUCOTROL) 10 MG tablet TAKE 1 TABLET BY MOUTH 2 TIMES A DAY   glucose blood (ACCU-CHEK GUIDE) test strip Use as instructed to test blood sugar daily. DX:E11.65   levocetirizine (XYZAL) 5 MG tablet Take 1 tablet (5 mg total) by mouth every evening.   metFORMIN (GLUCOPHAGE-XR) 500 MG 24 hr tablet TAKE 2 TABLETS BY MOUTH TWICE A DAY   [DISCONTINUED] atenolol (TENORMIN) 50 MG tablet TAKE 1 TABLET BY MOUTH DAILY   [DISCONTINUED] atorvastatin (LIPITOR) 20 MG tablet TAKE 1 TABLET BY MOUTH DAILY   [DISCONTINUED] dapagliflozin propanediol (FARXIGA) 10 MG TABS tablet Take 1 tablet (10 mg total) by mouth daily before breakfast.   [DISCONTINUED] lisinopril-hydrochlorothiazide (ZESTORETIC) 20-12.5 MG tablet TAKE 1 TABLET BY MOUTH DAILY   atenolol (TENORMIN) 50 MG tablet Take 1 tablet (50 mg total) by mouth daily.   atorvastatin (LIPITOR) 20 MG tablet Take 1 tablet (20 mg total) by mouth daily.   dapagliflozin propanediol (FARXIGA) 10 MG TABS tablet Take 1 tablet (10 mg total) by mouth daily before breakfast.   lisinopril-hydrochlorothiazide (ZESTORETIC) 20-12.5 MG tablet Take 1 tablet by mouth daily.   No facility-administered encounter medications on file as of 11/02/2023.    No Known Allergies  Pertinent ROS per HPI, otherwise unremarkable      Objective:  BP 127/74   Pulse 68   Temp (!) 95.2 F (35.1 C)   Ht 5\' 1"  (1.549 m)   Wt 125 lb 9.6 oz (57 kg)   SpO2 94%   BMI 23.73 kg/m    Wt Readings from Last 3 Encounters:  11/02/23 125 lb 9.6 oz (57 kg)  06/30/23 126 lb 9.6 oz (57.4 kg)   03/30/23 127 lb 12.8 oz (58 kg)    Physical Exam Vitals and nursing note reviewed.  Constitutional:      Appearance: She is normal weight.  HENT:     Head: Normocephalic and atraumatic.     Mouth/Throat:     Mouth: Mucous membranes are moist.  Eyes:     Conjunctiva/sclera: Conjunctivae normal.     Pupils: Pupils are equal, round, and reactive to light.  Cardiovascular:     Rate and Rhythm: Normal rate and regular rhythm.     Heart sounds: Normal heart sounds.  Pulmonary:     Effort: Pulmonary effort is normal.     Breath sounds: Normal breath sounds.  Musculoskeletal:     Cervical back: Neck supple.  Skin:    General: Skin is warm and dry.     Capillary Refill: Capillary refill takes less than 2 seconds.  Neurological:     General: No focal deficit present.     Mental Status: She is alert.     Comments: Nonverbal   Psychiatric:        Speech: She is noncommunicative.      Results for orders placed or performed in visit on 06/30/23  Bayer DCA Hb A1c Waived   Collection Time: 06/30/23  2:35 PM  Result Value Ref Range   HB A1C (BAYER DCA - WAIVED) 6.8 (H) 4.8 - 5.6 %  CMP14+EGFR   Collection Time: 06/30/23  2:39 PM  Result Value Ref Range   Glucose 237 (H) 70 - 99 mg/dL   BUN 15 8 - 27 mg/dL   Creatinine, Ser 4.09 0.57 - 1.00 mg/dL   eGFR 85 >81 XB/JYN/8.29   BUN/Creatinine Ratio 20 12 - 28   Sodium 142 134 - 144 mmol/L   Potassium 4.6 3.5 - 5.2 mmol/L   Chloride 106 96 - 106 mmol/L   CO2 21 20 - 29 mmol/L   Calcium 9.4 8.7 - 10.3 mg/dL   Total Protein 6.2 6.0 - 8.5 g/dL   Albumin 3.9 3.9 - 4.9 g/dL   Globulin, Total 2.3 1.5 - 4.5 g/dL   Bilirubin Total 0.5 0.0 - 1.2 mg/dL   Alkaline Phosphatase 57 44 - 121 IU/L   AST 20 0 - 40 IU/L   ALT 28 0 - 32 IU/L  Lipid panel   Collection Time: 06/30/23  2:39 PM  Result Value Ref Range   Cholesterol, Total 92 (L) 100 - 199 mg/dL   Triglycerides 562 (H) 0 - 149 mg/dL   HDL 32 (L) >13 mg/dL   VLDL Cholesterol Cal  33 5 - 40 mg/dL   LDL Chol Calc (NIH) 27 0 - 99 mg/dL   Chol/HDL Ratio 2.9 0.0 - 4.4 ratio       Pertinent labs & imaging results that were available during my care of the patient were reviewed by me and considered in my medical decision making.  Assessment & Plan:  Zamiyah was seen today for diabetes.  Diagnoses and all orders for this visit:  Type 2 diabetes mellitus with other specified complication, without long-term current use of insulin (HCC) -     CMP14+EGFR -     Lipid panel -     Bayer DCA Hb A1c Waived -     dapagliflozin propanediol (FARXIGA) 10 MG TABS tablet; Take 1 tablet (10 mg total) by mouth daily before breakfast. -     Microalbumin / creatinine urine ratio  Hypertension associated with diabetes (HCC) -     CMP14+EGFR -     Lipid panel -     Bayer DCA Hb A1c Waived -     atenolol (TENORMIN) 50 MG tablet; Take 1 tablet (50 mg total) by mouth daily. -     lisinopril-hydrochlorothiazide (ZESTORETIC) 20-12.5 MG tablet; Take 1 tablet by mouth daily.  Hyperlipidemia associated with type 2 diabetes mellitus (HCC) -     CMP14+EGFR -     Lipid panel -     Bayer DCA Hb A1c Waived -     atorvastatin (LIPITOR) 20 MG tablet; Take 1 tablet (20 mg total) by mouth daily.  Nonverbal Minimal communication     Assessment and Plan    Type 2 Diabetes Mellitus Type 2 Diabetes Mellitus with a  significant increase in HbA1c from 6.8% to 10.9%. Currently on Farxiga, glipizide, and metformin. Blood glucose levels around 220 mg/dL. No symptoms of polyuria, polydipsia, or polyphagia. Likely due to high carbohydrate intake (bananas, [place] fries). Discussed dietary changes and increased water intake. If HbA1c remains above 7% at the next visit, medication changes or initiation of injectable therapy will be considered. - Reinforce dietary modifications to reduce carbohydrate intake - Encourage increased water intake - Encourage physical activity such as walking - Recheck HbA1c in 3  months - Consider medication changes or initiation of injectable therapy if HbA1c remains above 7% at the next visit  Hypertension Hypertension well-controlled with Albertine Patricia and atenolol. No reported headaches, chest pain, or leg swelling. - Continue Xacduro and atenolol - Refill prescriptions for 90-day supply  Hyperlipidemia Hyperlipidemia managed with atorvastatin. No reported muscle aches or other side effects. - Continue atorvastatin - Refill prescription for 90-day supply  General Health Maintenance Routine health maintenance for a patient with chronic conditions. Monitoring for complications related to diabetes and hypertension. - Order urine microalbumin to assess kidney function - Check A1c levels - Refill all medications (atorvastatin, atenolol, Farxiga, lisinopril)  Follow-up - Schedule follow-up appointment in 3 months.          Continue all other maintenance medications.  Follow up plan: Return in about 3 months (around 01/30/2024) for DM.   Continue healthy lifestyle choices, including diet (rich in fruits, vegetables, and lean proteins, and low in salt and simple carbohydrates) and exercise (at least 30 minutes of moderate physical activity daily).  Educational handout given for DM  The above assessment and management plan was discussed with the patient. The patient verbalized understanding of and has agreed to the management plan. Patient is aware to call the clinic if they develop any new symptoms or if symptoms persist or worsen. Patient is aware when to return to the clinic for a follow-up visit. Patient educated on when it is appropriate to go to the emergency department.   Kari Baars, FNP-C [facility] [phone]

## 2023-11-03 LAB — CMP14+EGFR
ALT: 15 IU/L (ref 0–32)
AST: 13 IU/L (ref 0–40)
Albumin: 4.1 g/dL (ref 3.9–4.9)
Alkaline Phosphatase: 142 IU/L — ABNORMAL HIGH (ref 44–121)
BUN/Creatinine Ratio: 21 (ref 12–28)
BUN: 18 mg/dL (ref 8–27)
Bilirubin Total: 0.4 mg/dL (ref 0.0–1.2)
CO2: 21 mmol/L (ref 20–29)
Calcium: 9.5 mg/dL (ref 8.7–10.3)
Chloride: 106 mmol/L (ref 96–106)
Creatinine, Ser: 0.85 mg/dL (ref 0.57–1.00)
Globulin, Total: 2.3 g/dL (ref 1.5–4.5)
Glucose: 318 mg/dL — ABNORMAL HIGH (ref 70–99)
Potassium: 4.4 mmol/L (ref 3.5–5.2)
Sodium: 143 mmol/L (ref 134–144)
Total Protein: 6.4 g/dL (ref 6.0–8.5)
eGFR: 74 mL/min/1.73

## 2023-11-03 LAB — MICROALBUMIN / CREATININE URINE RATIO
Creatinine, Urine: 40.3 mg/dL
Microalb/Creat Ratio: 24 mg/g{creat} (ref 0–29)
Microalbumin, Urine: 9.7 ug/mL

## 2023-11-03 LAB — LIPID PANEL
Chol/HDL Ratio: 4.4 {ratio} (ref 0.0–4.4)
Cholesterol, Total: 118 mg/dL (ref 100–199)
HDL: 27 mg/dL — ABNORMAL LOW (ref >39)
LDL Chol Calc (NIH): 45 mg/dL (ref 0–99)
Triglycerides: 303 mg/dL — ABNORMAL HIGH (ref 0–149)
VLDL Cholesterol Cal: 46 mg/dL — ABNORMAL HIGH (ref 5–40)

## 2023-11-08 ENCOUNTER — Encounter: Payer: Self-pay | Admitting: Family Medicine

## 2023-11-23 ENCOUNTER — Other Ambulatory Visit: Payer: Self-pay | Admitting: Family Medicine

## 2023-11-23 ENCOUNTER — Encounter: Payer: Self-pay | Admitting: Family Medicine

## 2023-11-23 DIAGNOSIS — E1165 Type 2 diabetes mellitus with hyperglycemia: Secondary | ICD-10-CM

## 2023-11-27 ENCOUNTER — Other Ambulatory Visit: Payer: Self-pay | Admitting: Family Medicine

## 2023-11-27 DIAGNOSIS — E1165 Type 2 diabetes mellitus with hyperglycemia: Secondary | ICD-10-CM

## 2024-01-19 ENCOUNTER — Other Ambulatory Visit: Payer: Self-pay | Admitting: Family Medicine

## 2024-01-19 DIAGNOSIS — E1165 Type 2 diabetes mellitus with hyperglycemia: Secondary | ICD-10-CM

## 2024-02-06 ENCOUNTER — Ambulatory Visit: Payer: Medicare Other | Admitting: Family Medicine

## 2024-02-14 ENCOUNTER — Other Ambulatory Visit: Payer: Self-pay | Admitting: Family Medicine

## 2024-02-14 DIAGNOSIS — E1165 Type 2 diabetes mellitus with hyperglycemia: Secondary | ICD-10-CM

## 2024-02-20 ENCOUNTER — Ambulatory Visit: Admitting: Family Medicine

## 2024-02-27 ENCOUNTER — Ambulatory Visit (INDEPENDENT_AMBULATORY_CARE_PROVIDER_SITE_OTHER): Admitting: Family Medicine

## 2024-02-27 ENCOUNTER — Ambulatory Visit: Payer: Self-pay | Admitting: Family Medicine

## 2024-02-27 ENCOUNTER — Encounter: Payer: Self-pay | Admitting: Family Medicine

## 2024-02-27 VITALS — BP 139/86 | HR 79 | Temp 97.2°F | Ht 61.0 in | Wt 124.8 lb

## 2024-02-27 DIAGNOSIS — E1165 Type 2 diabetes mellitus with hyperglycemia: Secondary | ICD-10-CM | POA: Diagnosis not present

## 2024-02-27 DIAGNOSIS — I152 Hypertension secondary to endocrine disorders: Secondary | ICD-10-CM

## 2024-02-27 DIAGNOSIS — E1169 Type 2 diabetes mellitus with other specified complication: Secondary | ICD-10-CM | POA: Diagnosis not present

## 2024-02-27 DIAGNOSIS — Z7984 Long term (current) use of oral hypoglycemic drugs: Secondary | ICD-10-CM

## 2024-02-27 DIAGNOSIS — E1159 Type 2 diabetes mellitus with other circulatory complications: Secondary | ICD-10-CM

## 2024-02-27 DIAGNOSIS — R4701 Aphasia: Secondary | ICD-10-CM

## 2024-02-27 DIAGNOSIS — E785 Hyperlipidemia, unspecified: Secondary | ICD-10-CM

## 2024-02-27 LAB — BAYER DCA HB A1C WAIVED: HB A1C (BAYER DCA - WAIVED): 7 % — ABNORMAL HIGH (ref 4.8–5.6)

## 2024-02-27 MED ORDER — METFORMIN HCL ER 500 MG PO TB24: 500.0000 mg | ORAL_TABLET | Freq: Two times a day (BID) | ORAL | 1 refills | Status: DC

## 2024-02-27 MED ORDER — GLIPIZIDE 10 MG PO TABS
10.0000 mg | ORAL_TABLET | Freq: Two times a day (BID) | ORAL | 0 refills | Status: DC
Start: 2024-02-27 — End: 2024-04-15

## 2024-02-27 NOTE — Progress Notes (Signed)
 Subjective:  Patient ID: Barbara Schroeder, female    DOB: 30-Sep-1950, 73 y.o.   MRN: [id number]  Patient Care Team: Galvin Jules, FNP as PCP - General (Family Medicine) Delilah Fend, [facility] as Pharmacist (Family Medicine)   Chief Complaint:  Diabetes (3 month follow up )   HPI: Barbara Schroeder is a 73 y.o. female presenting on 02/27/2024 for Diabetes (3 month follow up )   Barbara Schroeder is a 73 year old female with diabetes and hypertension who presents for routine follow-up.  She has been managing her diabetes by adjusting her diet, specifically by removing bananas and milk, which has resulted in improved blood sugar levels. Her blood sugars are now running around 130 mg/dL. She is currently taking Farxiga , glipizide , and metformin  for her diabetes management. No confusion, weakness, sweating, or syncope.  Her hypertension is being managed with atenolol  and hydrochlorothiazide . She does not monitor her blood pressure at home. No headaches or chest pain. She is also on atorvastatin  for cholesterol management.  She had her last eye exam at the [facility]. No leg swelling.        Relevant past medical, surgical, family, and social history reviewed and updated as indicated.  Allergies and medications reviewed and updated. Data reviewed: Chart in Epic.   Past Medical History:  Diagnosis Date   Cataract    Diabetes mellitus without complication (HCC)    Hypertension    Osteopenia 04/28/2021    Past Surgical History:  Procedure Laterality Date   EYE SURGERY      Social History   Socioeconomic History   Marital status: Married    Spouse name: Not on file   Number of children: Not on file   Years of education: Not on file   Highest education level: Not on file  Occupational History   Occupation: Disabled  Tobacco Use   Smoking status: Former    Current packs/day: 0.00    Types: Cigarettes    Quit date: 1995    Years since quitting: 30.4    Smokeless tobacco: Never  Vaping Use   Vaping status: Never Used  Substance and Sexual Activity   Alcohol use: Not Currently   Drug use: Never   Sexual activity: Not on file  Other Topics Concern   Not on file  Social History Narrative   Not on file   Social Drivers of Health   Financial Resource Strain: Low Risk  (07/01/2022)   Overall Financial Resource Strain (CARDIA)    Difficulty of Paying Living Expenses: Not hard at all  Food Insecurity: No Food Insecurity (07/01/2022)   Hunger Vital Sign    Worried About Running Out of Food in the Last Year: Never true    Ran Out of Food in the Last Year: Never true  Transportation Needs: Unmet Transportation Needs (07/01/2022)   PRAPARE - Administrator, Civil Service (Medical): Yes    Lack of Transportation (Non-Medical): Yes  Physical Activity: Inactive (07/01/2022)   Exercise Vital Sign    Days of Exercise per Week: 0 days    Minutes of Exercise per Session: 0 min  Stress: No Stress Concern Present (07/01/2022)   [employer] of Occupational Health - Occupational Stress Questionnaire    Feeling of Stress : Not at all  Social Connections: Moderately Isolated (07/01/2022)   Social Connection and Isolation Panel [NHANES]    Frequency of Communication with Friends and Family: Twice a week  Frequency of Social Gatherings with Friends and Family: Once a week    Attends Religious Services: Never    Database administrator or Organizations: No    Attends Banker Meetings: Never    Marital Status: Married  Catering manager Violence: Not At Risk (07/01/2022)   Humiliation, Afraid, Rape, and Kick questionnaire    Fear of Current or Ex-Partner: No    Emotionally Abused: No    Physically Abused: No    Sexually Abused: No    Outpatient Encounter Medications as of 02/27/2024  Medication Sig   atenolol  (TENORMIN ) 50 MG tablet Take 1 tablet (50 mg total) by mouth daily.   atorvastatin  (LIPITOR) 20 MG tablet  Take 1 tablet (20 mg total) by mouth daily.   Blood Glucose Monitoring Suppl (ACCU-CHEK GUIDE ME) w/Device KIT Test BS daily Dx E11.65   dapagliflozin  propanediol (FARXIGA ) 10 MG TABS tablet Take 1 tablet (10 mg total) by mouth daily before breakfast.   glipiZIDE  (GLUCOTROL ) 10 MG tablet Take 1 tablet (10 mg total) by mouth 2 (two) times daily.   glucose blood (ACCU-CHEK GUIDE) test strip Use as instructed to test blood sugar daily. DX:E11.65   levocetirizine (XYZAL ) 5 MG tablet Take 1 tablet (5 mg total) by mouth every evening.   lisinopril -hydrochlorothiazide  (ZESTORETIC ) 20-12.5 MG tablet Take 1 tablet by mouth daily.   metFORMIN  (GLUCOPHAGE -XR) 500 MG 24 hr tablet Take 1 tablet (500 mg total) by mouth 2 (two) times daily with a meal.   [DISCONTINUED] glipiZIDE  (GLUCOTROL ) 10 MG tablet TAKE 1 TABLET BY MOUTH 2 TIMES A DAY   [DISCONTINUED] metFORMIN  (GLUCOPHAGE -XR) 500 MG 24 hr tablet TAKE 2 TABLETS BY MOUTH 2 TIMES A DAY   No facility-administered encounter medications on file as of 02/27/2024.    No Known Allergies  Pertinent ROS per HPI, otherwise unremarkable      Objective:  BP 139/86   Pulse 79   Temp (!) 97.2 F (36.2 C)   Ht 5\' 1"  (1.549 m)   Wt 124 lb 12.8 oz (56.6 kg)   SpO2 95%   BMI 23.58 kg/m    Wt Readings from Last 3 Encounters:  02/27/24 124 lb 12.8 oz (56.6 kg)  11/02/23 125 lb 9.6 oz (57 kg)  06/30/23 126 lb 9.6 oz (57.4 kg)    Physical Exam Vitals and nursing note reviewed.  Constitutional:      General: She is not in acute distress.    Appearance: She is normal weight. She is not ill-appearing, toxic-appearing or diaphoretic.  HENT:     Head: Normocephalic and atraumatic.     Mouth/Throat:     Mouth: Mucous membranes are moist.  Eyes:     Conjunctiva/sclera: Conjunctivae normal.     Pupils: Pupils are equal, round, and reactive to light.  Cardiovascular:     Rate and Rhythm: Normal rate and regular rhythm.     Heart sounds: Normal heart sounds.   Pulmonary:     Effort: Pulmonary effort is normal.     Breath sounds: Normal breath sounds.  Musculoskeletal:     Cervical back: Neck supple.  Skin:    General: Skin is warm and dry.     Capillary Refill: Capillary refill takes less than 2 seconds.  Neurological:     Mental Status: She is alert. Mental status is at baseline.  Psychiatric:        Speech: She is noncommunicative.     Comments: Nonverbal      Results  for orders placed or performed in visit on 11/02/23  Bayer DCA Hb A1c Waived   Collection Time: 11/02/23 10:48 AM  Result Value Ref Range   HB A1C (BAYER DCA - WAIVED) 10.9 (H) 4.8 - 5.6 %  CMP14+EGFR   Collection Time: 11/02/23 10:50 AM  Result Value Ref Range   Glucose 318 (H) 70 - 99 mg/dL   BUN 18 8 - 27 mg/dL   Creatinine, Ser 1.61 0.57 - 1.00 mg/dL   eGFR 74 >09 UE/AVW/0.98   BUN/Creatinine Ratio 21 12 - 28   Sodium 143 134 - 144 mmol/L   Potassium 4.4 3.5 - 5.2 mmol/L   Chloride 106 96 - 106 mmol/L   CO2 21 20 - 29 mmol/L   Calcium  9.5 8.7 - 10.3 mg/dL   Total Protein 6.4 6.0 - 8.5 g/dL   Albumin 4.1 3.9 - 4.9 g/dL   Globulin, Total 2.3 1.5 - 4.5 g/dL   Bilirubin Total 0.4 0.0 - 1.2 mg/dL   Alkaline Phosphatase 142 (H) 44 - 121 IU/L   AST 13 0 - 40 IU/L   ALT 15 0 - 32 IU/L  Lipid panel   Collection Time: 11/02/23 10:50 AM  Result Value Ref Range   Cholesterol, Total 118 100 - 199 mg/dL   Triglycerides 119 (H) 0 - 149 mg/dL   HDL 27 (L) >14 mg/dL   VLDL Cholesterol Cal 46 (H) 5 - 40 mg/dL   LDL Chol Calc (NIH) 45 0 - 99 mg/dL   Chol/HDL Ratio 4.4 0.0 - 4.4 ratio  Microalbumin / creatinine urine ratio   Collection Time: 11/02/23 11:50 AM  Result Value Ref Range   Creatinine, Urine 40.3 Not Estab. mg/dL   Microalbumin, Urine 9.7 Not Estab. ug/mL   Microalb/Creat Ratio 24 0 - 29 mg/g creat       Pertinent labs & imaging results that were available during my care of the patient were reviewed by me and considered in my medical decision  making.  Assessment & Plan:  Shineka was seen today for diabetes.  Diagnoses and all orders for this visit:  Hypertension associated with diabetes (HCC) -     CMP14+EGFR -     Lipid panel -     Bayer DCA Hb A1c Waived  Controlled type 2 diabetes mellitus with hyperglycemia, without long-term current use of insulin (HCC) -     CMP14+EGFR -     Lipid panel -     Bayer DCA Hb A1c Waived -     glipiZIDE  (GLUCOTROL ) 10 MG tablet; Take 1 tablet (10 mg total) by mouth 2 (two) times daily. -     metFORMIN  (GLUCOPHAGE -XR) 500 MG 24 hr tablet; Take 1 tablet (500 mg total) by mouth 2 (two) times daily with a meal.  Hyperlipidemia associated with type 2 diabetes mellitus (HCC) -     CMP14+EGFR -     Lipid panel -     Bayer DCA Hb A1c Waived  Nonverbal Baseline        Type 2 Diabetes Mellitus Type 2 Diabetes Mellitus is well-controlled with current medications. Blood sugars have improved to around 130 after dietary modifications, including the removal of bananas and milk. Hemoglobin A1c is at 7.0, which is within the target range of 6 to 7. No symptoms of hypoglycemia such as confusion, weakness, or sweating have been reported. - Continue current diabetes medications: Farxiga , glipizide , and metformin  - Obtain records from Dr. Sherial Dimes regarding recent eye exam - Re-evaluate  in 3 months with blood work to monitor diabetes control  Hypertension Hypertension is well-managed with current medications. No reported symptoms such as headaches or chest pain, and no peripheral edema. Blood pressure is not monitored at home. - Continue current antihypertensive medications: atenolol  and hydrochlorothiazide   Hyperlipidemia Hyperlipidemia is managed with atorvastatin . - Continue atorvastatin            Continue all other maintenance medications.  Follow up plan: Return in about 3 months (around 05/29/2024), or if symptoms worsen or fail to improve, for all labs.   Continue healthy  lifestyle choices, including diet (rich in fruits, vegetables, and lean proteins, and low in salt and simple carbohydrates) and exercise (at least 30 minutes of moderate physical activity daily).  Educational handout given for DM  The above assessment and management plan was discussed with the patient. The patient verbalized understanding of and has agreed to the management plan. Patient is aware to call the clinic if they develop any new symptoms or if symptoms persist or worsen. Patient is aware when to return to the clinic for a follow-up visit. Patient educated on when it is appropriate to go to the emergency department.   Barbara Parrot, FNP-C [facility] [phone]

## 2024-02-27 NOTE — Patient Instructions (Addendum)

## 2024-02-28 LAB — CMP14+EGFR
ALT: 23 IU/L (ref 0–32)
AST: 19 IU/L (ref 0–40)
Albumin: 4.2 g/dL (ref 3.9–4.9)
Alkaline Phosphatase: 74 IU/L (ref 44–121)
BUN/Creatinine Ratio: 15 (ref 12–28)
BUN: 11 mg/dL (ref 8–27)
Bilirubin Total: 0.5 mg/dL (ref 0.0–1.2)
CO2: 18 mmol/L — ABNORMAL LOW (ref 20–29)
Calcium: 9.4 mg/dL (ref 8.7–10.3)
Chloride: 112 mmol/L — ABNORMAL HIGH (ref 96–106)
Creatinine, Ser: 0.73 mg/dL (ref 0.57–1.00)
Globulin, Total: 2 g/dL (ref 1.5–4.5)
Glucose: 178 mg/dL — ABNORMAL HIGH (ref 70–99)
Potassium: 3.9 mmol/L (ref 3.5–5.2)
Sodium: 145 mmol/L — ABNORMAL HIGH (ref 134–144)
Total Protein: 6.2 g/dL (ref 6.0–8.5)
eGFR: 88 mL/min/{1.73_m2} (ref >59)

## 2024-02-28 LAB — LIPID PANEL
Chol/HDL Ratio: 4.3 ratio (ref 0.0–4.4)
Cholesterol, Total: 155 mg/dL (ref 100–199)
HDL: 36 mg/dL — ABNORMAL LOW (ref >39)
LDL Chol Calc (NIH): 69 mg/dL (ref 0–99)
Triglycerides: 311 mg/dL — ABNORMAL HIGH (ref 0–149)
VLDL Cholesterol Cal: 50 mg/dL — ABNORMAL HIGH (ref 5–40)

## 2024-03-19 ENCOUNTER — Other Ambulatory Visit: Payer: Self-pay | Admitting: Family Medicine

## 2024-03-19 DIAGNOSIS — E1165 Type 2 diabetes mellitus with hyperglycemia: Secondary | ICD-10-CM

## 2024-04-15 ENCOUNTER — Other Ambulatory Visit: Payer: Self-pay | Admitting: Family Medicine

## 2024-04-15 DIAGNOSIS — E1165 Type 2 diabetes mellitus with hyperglycemia: Secondary | ICD-10-CM

## 2024-05-03 ENCOUNTER — Other Ambulatory Visit: Payer: Self-pay | Admitting: Family Medicine

## 2024-05-03 DIAGNOSIS — E1165 Type 2 diabetes mellitus with hyperglycemia: Secondary | ICD-10-CM

## 2024-05-21 ENCOUNTER — Other Ambulatory Visit: Payer: Self-pay | Admitting: Family Medicine

## 2024-05-21 DIAGNOSIS — E1165 Type 2 diabetes mellitus with hyperglycemia: Secondary | ICD-10-CM

## 2024-05-30 ENCOUNTER — Ambulatory Visit: Payer: Self-pay | Admitting: Family Medicine

## 2024-05-30 ENCOUNTER — Encounter: Payer: Self-pay | Admitting: Family Medicine

## 2024-05-30 ENCOUNTER — Ambulatory Visit (INDEPENDENT_AMBULATORY_CARE_PROVIDER_SITE_OTHER): Admitting: Family Medicine

## 2024-05-30 ENCOUNTER — Ambulatory Visit (INDEPENDENT_AMBULATORY_CARE_PROVIDER_SITE_OTHER)

## 2024-05-30 VITALS — BP 158/87 | HR 78 | Temp 96.8°F | Ht 61.0 in | Wt 124.8 lb

## 2024-05-30 DIAGNOSIS — M810 Age-related osteoporosis without current pathological fracture: Secondary | ICD-10-CM

## 2024-05-30 DIAGNOSIS — R4189 Other symptoms and signs involving cognitive functions and awareness: Secondary | ICD-10-CM

## 2024-05-30 DIAGNOSIS — I152 Hypertension secondary to endocrine disorders: Secondary | ICD-10-CM

## 2024-05-30 DIAGNOSIS — E1169 Type 2 diabetes mellitus with other specified complication: Secondary | ICD-10-CM | POA: Diagnosis not present

## 2024-05-30 DIAGNOSIS — R4701 Aphasia: Secondary | ICD-10-CM

## 2024-05-30 DIAGNOSIS — E1159 Type 2 diabetes mellitus with other circulatory complications: Secondary | ICD-10-CM | POA: Diagnosis not present

## 2024-05-30 DIAGNOSIS — E1165 Type 2 diabetes mellitus with hyperglycemia: Secondary | ICD-10-CM

## 2024-05-30 DIAGNOSIS — M8589 Other specified disorders of bone density and structure, multiple sites: Secondary | ICD-10-CM | POA: Diagnosis not present

## 2024-05-30 DIAGNOSIS — Z7984 Long term (current) use of oral hypoglycemic drugs: Secondary | ICD-10-CM

## 2024-05-30 DIAGNOSIS — E785 Hyperlipidemia, unspecified: Secondary | ICD-10-CM

## 2024-05-30 LAB — BAYER DCA HB A1C WAIVED: HB A1C (BAYER DCA - WAIVED): 6.4 % — ABNORMAL HIGH (ref 4.8–5.6)

## 2024-05-30 MED ORDER — DAPAGLIFLOZIN PROPANEDIOL 10 MG PO TABS: 10.0000 mg | ORAL_TABLET | Freq: Every day | ORAL | 5 refills | Status: AC

## 2024-05-30 MED ORDER — GLIPIZIDE 10 MG PO TABS: 10.0000 mg | ORAL_TABLET | Freq: Two times a day (BID) | ORAL | 1 refills | Status: AC

## 2024-05-30 NOTE — Progress Notes (Signed)
 Subjective:  Patient ID: Barbara Schroeder, female    DOB: [birth year], 73 y.o.   MRN: [id number]  Patient Care Team: Severa Rock HERO, FNP as PCP - General (Family Medicine) Billee Mliss BIRCH, [facility] as Pharmacist (Family Medicine)   Chief Complaint:  Medical Management of Chronic Issues (3 month chronic follow up )   HPI: Barbara Schroeder is a 73 y.o. female presenting on 05/30/2024 for Medical Management of Chronic Issues (3 month chronic follow up )   A 73 year old female with diabetes and hypertension presents for routine follow-up.  She has no new complaints and is eating, drinking, and sleeping well. There have been no changes in her medications since the last visit.  Her blood sugar levels at home have been ranging between 100 and 140 mg/dL. She is currently taking metformin , glipizide , and Farxiga  for diabetes management. She is on atorvastatin  for cholesterol management, which she takes at night. For hypertension, she is taking atenolol  and lisinopril  hydrochlorothiazide .  She had an eye exam within the past year conducted by Doctor Al[facility] at [facility].  She has not been complaining about headaches or any other symptoms.          Relevant past medical, surgical, family, and social history reviewed and updated as indicated.  Allergies and medications reviewed and updated. Data reviewed: Chart in Epic.   Past Medical History:  Diagnosis Date   Cataract    Diabetes mellitus without complication (HCC)    Hypertension    Osteopenia 04/28/2021    Past Surgical History:  Procedure Laterality Date   EYE SURGERY      Social History   Socioeconomic History   Marital status: Married    Spouse name: Not on file   Number of children: Not on file   Years of education: Not on file   Highest education level: Not on file  Occupational History   Occupation: Disabled  Tobacco Use   Smoking status: Former    Current packs/day: 0.00    Types: Cigarettes    Quit date: 1995     Years since quitting: 30.7   Smokeless tobacco: Never  Vaping Use   Vaping status: Never Used  Substance and Sexual Activity   Alcohol use: Not Currently   Drug use: Never   Sexual activity: Not on file  Other Topics Concern   Not on file  Social History Narrative   Not on file   Social Drivers of Health   Financial Resource Strain: Low Risk  (07/01/2022)   Overall Financial Resource Strain (CARDIA)    Difficulty of Paying Living Expenses: Not hard at all  Food Insecurity: No Food Insecurity (07/01/2022)   Hunger Vital Sign    Worried About Running Out of Food in the Last Year: Never true    Ran Out of Food in the Last Year: Never true  Transportation Needs: Unmet Transportation Needs (07/01/2022)   PRAPARE - Administrator, Civil Service (Medical): Yes    Lack of Transportation (Non-Medical): Yes  Physical Activity: Inactive (07/01/2022)   Exercise Vital Sign    Days of Exercise per Week: 0 days    Minutes of Exercise per Session: 0 min  Stress: No Stress Concern Present (07/01/2022)   [employer] of Occupational Health - Occupational Stress Questionnaire    Feeling of Stress : Not at all  Social Connections: Moderately Isolated (07/01/2022)   Social Connection and Isolation Panel    Frequency of Communication with Friends and  Family: Twice a week    Frequency of Social Gatherings with Friends and Family: Once a week    Attends Religious Services: Never    Database administrator or Organizations: No    Attends Banker Meetings: Never    Marital Status: Married  Catering manager Violence: Not At Risk (07/01/2022)   Humiliation, Afraid, Rape, and Kick questionnaire    Fear of Current or Ex-Partner: No    Emotionally Abused: No    Physically Abused: No    Sexually Abused: No    Outpatient Encounter Medications as of 05/30/2024  Medication Sig   atenolol  (TENORMIN ) 50 MG tablet Take 1 tablet (50 mg total) by mouth daily.   atorvastatin   (LIPITOR) 20 MG tablet Take 1 tablet (20 mg total) by mouth daily.   Blood Glucose Monitoring Suppl (ACCU-CHEK GUIDE ME) w/Device KIT Test BS daily Dx E11.65   glucose blood (ACCU-CHEK GUIDE TEST) test strip TEST BLOOD SUGAR DAILY E11.65   levocetirizine (XYZAL ) 5 MG tablet Take 1 tablet (5 mg total) by mouth every evening.   lisinopril -hydrochlorothiazide  (ZESTORETIC ) 20-12.5 MG tablet Take 1 tablet by mouth daily.   metFORMIN  (GLUCOPHAGE -XR) 500 MG 24 hr tablet TAKE 2 TABLETS BY MOUTH 2 TIMES A DAY   dapagliflozin  propanediol (FARXIGA ) 10 MG TABS tablet Take 1 tablet (10 mg total) by mouth daily before breakfast.   glipiZIDE  (GLUCOTROL ) 10 MG tablet Take 1 tablet (10 mg total) by mouth 2 (two) times daily.   [DISCONTINUED] dapagliflozin  propanediol (FARXIGA ) 10 MG TABS tablet Take 1 tablet (10 mg total) by mouth daily before breakfast.   [DISCONTINUED] glipiZIDE  (GLUCOTROL ) 10 MG tablet TAKE 1 TABLET BY MOUTH 2 TIMES A DAY   No facility-administered encounter medications on file as of 05/30/2024.    No Known Allergies  Pertinent ROS per HPI, otherwise unremarkable      Objective:  BP (!) 158/87   Pulse 78   Temp (!) 96.8 F (36 C)   Ht 5' 1 (1.549 m)   Wt 124 lb 12.8 oz (56.6 kg)   SpO2 95%   BMI 23.58 kg/m    Wt Readings from Last 3 Encounters:  05/30/24 124 lb 12.8 oz (56.6 kg)  02/27/24 124 lb 12.8 oz (56.6 kg)  11/02/23 125 lb 9.6 oz (57 kg)    Physical Exam Vitals and nursing note reviewed.  Constitutional:      General: She is not in acute distress.    Appearance: Normal appearance. She is not ill-appearing, toxic-appearing or diaphoretic.  HENT:     Head: Normocephalic and atraumatic.     Nose: Nose normal.     Mouth/Throat:     Mouth: Mucous membranes are moist.  Eyes:     Conjunctiva/sclera: Conjunctivae normal.     Pupils: Pupils are equal, round, and reactive to light.  Cardiovascular:     Rate and Rhythm: Normal rate and regular rhythm.     Heart  sounds: Normal heart sounds.  Pulmonary:     Effort: Pulmonary effort is normal.     Breath sounds: Normal breath sounds.  Musculoskeletal:     Cervical back: Neck supple.     Right lower leg: No edema.     Left lower leg: No edema.  Skin:    General: Skin is warm and dry.     Capillary Refill: Capillary refill takes less than 2 seconds.  Neurological:     Mental Status: She is alert. Mental status is at baseline.  Psychiatric:        Mood and Affect: Mood normal.        Speech: She is noncommunicative.        Behavior: Behavior normal.     Comments: nonverbal      Results for orders placed or performed in visit on 02/27/24  Bayer DCA Hb A1c Waived   Collection Time: 02/27/24 12:01 PM  Result Value Ref Range   HB A1C (BAYER DCA - WAIVED) 7.0 (H) 4.8 - 5.6 %  CMP14+EGFR   Collection Time: 02/27/24 12:02 PM  Result Value Ref Range   Glucose 178 (H) 70 - 99 mg/dL   BUN 11 8 - 27 mg/dL   Creatinine, Ser 9.26 0.57 - 1.00 mg/dL   eGFR 88 >40 fO/fpw/8.26   BUN/Creatinine Ratio 15 12 - 28   Sodium 145 (H) 134 - 144 mmol/L   Potassium 3.9 3.5 - 5.2 mmol/L   Chloride 112 (H) 96 - 106 mmol/L   CO2 18 (L) 20 - 29 mmol/L   Calcium  9.4 8.7 - 10.3 mg/dL   Total Protein 6.2 6.0 - 8.5 g/dL   Albumin 4.2 3.9 - 4.9 g/dL   Globulin, Total 2.0 1.5 - 4.5 g/dL   Bilirubin Total 0.5 0.0 - 1.2 mg/dL   Alkaline Phosphatase 74 44 - 121 IU/L   AST 19 0 - 40 IU/L   ALT 23 0 - 32 IU/L  Lipid panel   Collection Time: 02/27/24 12:02 PM  Result Value Ref Range   Cholesterol, Total 155 100 - 199 mg/dL   Triglycerides 688 (H) 0 - 149 mg/dL   HDL 36 (L) >60 mg/dL   VLDL Cholesterol Cal 50 (H) 5 - 40 mg/dL   LDL Chol Calc (NIH) 69 0 - 99 mg/dL   Chol/HDL Ratio 4.3 0.0 - 4.4 ratio       Pertinent labs & imaging results that were available during my care of the patient were reviewed by me and considered in my medical decision making.  Assessment & Plan:  Barbara Schroeder was seen today for medical  management of chronic issues.  Diagnoses and all orders for this visit:  Type 2 diabetes mellitus with other specified complication, without long-term current use of insulin (HCC) -     Bayer DCA Hb A1c Waived -     CBC with Differential/Platelet -     CMP14+EGFR -     Lipid panel -     Vitamin B12 -     glipiZIDE  (GLUCOTROL ) 10 MG tablet; Take 1 tablet (10 mg total) by mouth 2 (two) times daily. -     dapagliflozin  propanediol (FARXIGA ) 10 MG TABS tablet; Take 1 tablet (10 mg total) by mouth daily before breakfast.  Controlled type 2 diabetes mellitus with hyperglycemia, without long-term current use of insulin (HCC) -     Bayer DCA Hb A1c Waived -     CBC with Differential/Platelet -     CMP14+EGFR -     Lipid panel -     Vitamin B12 -     glipiZIDE  (GLUCOTROL ) 10 MG tablet; Take 1 tablet (10 mg total) by mouth 2 (two) times daily. -     dapagliflozin  propanediol (FARXIGA ) 10 MG TABS tablet; Take 1 tablet (10 mg total) by mouth daily before breakfast.  Hypertension associated with diabetes (HCC) -     Bayer DCA Hb A1c Waived -     CBC with Differential/Platelet -     CMP14+EGFR -  Lipid panel  Hyperlipidemia associated with type 2 diabetes mellitus (HCC) -     Bayer DCA Hb A1c Waived -     CBC with Differential/Platelet -     CMP14+EGFR -     Lipid panel  Cognitive impairment -     Thyroid  Panel With TSH  Nonverbal -     Thyroid  Panel With TSH  Osteopenia of multiple sites -     DG [facility] DEXA      Type 2 diabetes mellitus Type 2 diabetes mellitus is managed with metformin , glipizide , and Farxiga . Home blood glucose levels range between 100 and 140 mg/dL. - Check hemoglobin A1c to assess long-term glucose control. - Check vitamin B12 level due to metformin  use.  Hypertension Hypertension is well-controlled with atenolol  and lisinopril  hydrochlorothiazide .  Hyperlipidemia Hyperlipidemia is managed with atorvastatin . - Check cholesterol levels to assess  management efficacy.  General Health Maintenance Eye exam completed within the past year. Bone density assessment is due. - Order DEXA scan to assess bone density.          Continue all other maintenance medications.  Follow up plan: Return in about 3 months (around 08/29/2024) for DM.   Continue healthy lifestyle choices, including diet (rich in fruits, vegetables, and lean proteins, and low in salt and simple carbohydrates) and exercise (at least 30 minutes of moderate physical activity daily).  Educational handout given for DM  The above assessment and management plan was discussed with the patient. The patient verbalized understanding of and has agreed to the management plan. Patient is aware to call the clinic if they develop any new symptoms or if symptoms persist or worsen. Patient is aware when to return to the clinic for a follow-up visit. Patient educated on when it is appropriate to go to the emergency department.   Barbara Bruns, FNP-C [facility] [phone]

## 2024-05-30 NOTE — Patient Instructions (Signed)

## 2024-05-31 LAB — LIPID PANEL
Chol/HDL Ratio: 4.8 ratio — ABNORMAL HIGH (ref 0.0–4.4)
Cholesterol, Total: 159 mg/dL (ref 100–199)
HDL: 33 mg/dL — ABNORMAL LOW (ref >39)
LDL Chol Calc (NIH): 73 mg/dL (ref 0–99)
Triglycerides: 331 mg/dL — ABNORMAL HIGH (ref 0–149)
VLDL Cholesterol Cal: 53 mg/dL — ABNORMAL HIGH (ref 5–40)

## 2024-05-31 LAB — CBC WITH DIFFERENTIAL/PLATELET
Basophils Absolute: 0.1 x10E3/uL (ref 0.0–0.2)
Basos: 1 %
EOS (ABSOLUTE): 0.4 x10E3/uL (ref 0.0–0.4)
Eos: 8 %
Hematocrit: 44.3 % (ref 34.0–46.6)
Hemoglobin: 14.6 g/dL (ref 11.1–15.9)
Immature Grans (Abs): 0 x10E3/uL (ref 0.0–0.1)
Immature Granulocytes: 0 %
Lymphocytes Absolute: 1.8 x10E3/uL (ref 0.7–3.1)
Lymphs: 32 %
MCH: 29.2 pg (ref 26.6–33.0)
MCHC: 33 g/dL (ref 31.5–35.7)
MCV: 89 fL (ref 79–97)
Monocytes Absolute: 0.4 x10E3/uL (ref 0.1–0.9)
Monocytes: 7 %
Neutrophils Absolute: 2.9 x10E3/uL (ref 1.4–7.0)
Neutrophils: 52 %
Platelets: 163 x10E3/uL (ref 150–450)
RBC: 5 x10E6/uL (ref 3.77–5.28)
RDW: 13.2 % (ref 11.7–15.4)
WBC: 5.5 x10E3/uL (ref 3.4–10.8)

## 2024-05-31 LAB — THYROID PANEL WITH TSH
Free Thyroxine Index: 2.3 (ref 1.2–4.9)
T3 Uptake Ratio: 29 % (ref 24–39)
T4, Total: 8.1 ug/dL (ref 4.5–12.0)
TSH: 2.79 u[IU]/mL (ref 0.450–4.500)

## 2024-05-31 LAB — CMP14+EGFR
ALT: 13 IU/L (ref 0–32)
AST: 15 IU/L (ref 0–40)
Albumin: 4.3 g/dL (ref 3.9–4.9)
Alkaline Phosphatase: 65 IU/L (ref 44–121)
BUN/Creatinine Ratio: 18 (ref 12–28)
BUN: 13 mg/dL (ref 8–27)
Bilirubin Total: 0.4 mg/dL (ref 0.0–1.2)
CO2: 18 mmol/L — ABNORMAL LOW (ref 20–29)
Calcium: 9.3 mg/dL (ref 8.7–10.3)
Chloride: 106 mmol/L (ref 96–106)
Creatinine, Ser: 0.74 mg/dL (ref 0.57–1.00)
Globulin, Total: 2.4 g/dL (ref 1.5–4.5)
Glucose: 168 mg/dL — ABNORMAL HIGH (ref 70–99)
Potassium: 3.9 mmol/L (ref 3.5–5.2)
Sodium: 140 mmol/L (ref 134–144)
Total Protein: 6.7 g/dL (ref 6.0–8.5)
eGFR: 87 mL/min/1.73 (ref >59)

## 2024-05-31 LAB — VITAMIN B12: Vitamin B-12: 457 pg/mL (ref 232–1245)

## 2024-05-31 MED ORDER — FENOFIBRATE 145 MG PO TABS: 145.0000 mg | ORAL_TABLET | Freq: Every day | ORAL | 1 refills | Status: AC

## 2024-06-06 DIAGNOSIS — M810 Age-related osteoporosis without current pathological fracture: Secondary | ICD-10-CM | POA: Insufficient documentation

## 2024-06-12 ENCOUNTER — Telehealth: Payer: Self-pay

## 2024-06-12 NOTE — Progress Notes (Signed)
 Care Guide Pharmacy Note  06/12/2024 Name: Barbara Schroeder MRN: [id number] DOB: July 13, 1951  Referred By: Severa Rock HERO, FNP Reason for referral: Complex Care Management (Outreach to schedule with Pharm d )   Barbara Schroeder is a 73 y.o. year old female who is a primary care patient of Severa Rock HERO, FNP.  Barbara Schroeder was referred to the pharmacist for assistance related to: osteoporosis  Successful contact was made with the patient to discuss pharmacy services including being ready for the pharmacist to call at least 5 minutes before the scheduled appointment time and to have medication bottles and any blood pressure readings ready for review. The patient agreed to meet with the pharmacist via telephone visit on (date/time). 06/26/2024  Jeoffrey Buffalo , RMA     [place]  [facility], [facility] Guide  Direct Dial: [phone]  Website: [email or web address]

## 2024-07-03 ENCOUNTER — Other Ambulatory Visit (INDEPENDENT_AMBULATORY_CARE_PROVIDER_SITE_OTHER)

## 2024-07-03 DIAGNOSIS — M818 Other osteoporosis without current pathological fracture: Secondary | ICD-10-CM

## 2024-07-03 MED ORDER — ALENDRONATE SODIUM 70 MG PO TABS: 70.0000 mg | ORAL_TABLET | ORAL | 11 refills | Status: AC

## 2024-07-03 NOTE — Progress Notes (Signed)
 07/03/2024 Name: Barbara Schroeder MRN: [id number] DOB: [birth year]  Chief Complaint  Patient presents with   Osteoporosis    Barbara Schroeder is a 73 y.o. year old female who presented for a telephone visit.   They were referred to the pharmacist by their PCP for assistance in managing osteoporosis .   Subjective:  Spoke with patient's husband Barbara Schroeder as patient is non-verbal.  Patient has been newly diagnosed with osteoporosis from DEXA scan on 05/30/24.  She has started taking vitamin D and calcium .  Care Team: Primary Care Provider: Severa Schroeder HERO, FNP   Medication Access/Adherence  Current Pharmacy:  [facility] [id number] - [place], [place] - [address] [place] [address] Phone: [phone] Fax: [phone]  [facility] - [place], [place] - [address] [place] [address] Phone: [phone] Fax: [phone]  [id number] - [place], [place] - [address] [place] [address] Phone: [phone] Fax: [phone]  [facility] [address] [place] - [address] [place] [address] Phone: [phone] Fax: [phone]   Patient reports affordability concerns with their medications: No  Patient reports access/transportation concerns to their pharmacy: No  Patient reports adherence concerns with their medications:  No  husband assists with patient's care and medications   Osteoporosis:  Current medications: none Medications tried in the past: n/a 1st DEXA scan was 2022--T-scores have dropped significantly  Current supplements: just started vitamin D and calcium    Current physical activity: as able; limited  Most recent DEXA: 05/30/2024  CLINICAL DATA:  73 year old Female Postmenopausal. Osteopenia, screening for osteoporosis   Exclusions: Lumbar spine due to significant degenerative changes.   COMPARISON:  04/27/2021.    FINDINGS: Scan quality: Good.   LEFT FEMORAL NECK:  BMD (in g/cm2): 0.635  T-score: -2.9  Z-score: -1.0   LEFT TOTAL HIP:  BMD (in g/cm2): 0.648  T-score: -2.9  Z-score: -1.2   RIGHT FEMORAL NECK:  BMD (in g/cm2): 0.683  T-score: -2.6  Z-score: -0.7   RIGHT TOTAL HIP:  BMD (in g/cm2): 0.703  T-score: -2.4  Z-score: -0.7   LEFT FOREARM (RADIUS 33%):  BMD (in g/cm2): 0.541  T-score: -3.9  Z-score: -2.0   FRAX 10-YEAR PROBABILITY OF FRACTURE:  FRAX not reported as the lowest BMD is not in the osteopenia range.   IMPRESSION: Osteoporosis based on BMD.   Fracture risk is increased. Increased risk is based on low BMD.   Current medication access support: n/a   Objective:  Lab Results  Component Value Date   HGBA1C 6.4 (H) 05/30/2024    Lab Results  Component Value Date   CREATININE 0.74 05/30/2024   BUN 13 05/30/2024   NA 140 05/30/2024   K 3.9 05/30/2024   CL 106 05/30/2024   CO2 18 (L) 05/30/2024    Lab Results  Component Value Date   CHOL 159 05/30/2024   HDL 33 (L) 05/30/2024   LDLCALC 73 05/30/2024   TRIG 331 (H) 05/30/2024   CHOLHDL 4.8 (H) 05/30/2024    Medications Reviewed Today     Reviewed by Billee Mliss BIRCH, [facility] (Pharmacist) on 07/03/24 at 1329  Med List Status: <None>   Medication Order Taking? Sig Documenting Provider Last Dose Status Informant  atenolol  (TENORMIN ) 50 MG tablet [id number]  Take 1 tablet (50 mg total) by mouth daily. Barbara Schroeder HERO, FNP  Active   atorvastatin  (  LIPITOR) 20 MG tablet [id number]  Take 1 tablet (20 mg total) by mouth daily. Barbara Schroeder HERO, FNP  Active   Blood Glucose Monitoring Suppl (ACCU-CHEK GUIDE ME) w/Device KIT [id number]  Test BS daily Dx E11.65 Barbara Schroeder HERO, FNP  Active   dapagliflozin  propanediol (FARXIGA ) 10 MG TABS tablet [id number]  Take 1 tablet (10 mg total) by mouth daily before breakfast. Barbara Schroeder HERO, FNP  Active   fenofibrate  (TRICOR ) 145 MG tablet [id number]  Take 1 tablet (145 mg total)  by mouth daily. Barbara Schroeder HERO, FNP  Active   glipiZIDE  (GLUCOTROL ) 10 MG tablet [id number]  Take 1 tablet (10 mg total) by mouth 2 (two) times daily. Barbara Schroeder HERO, FNP  Active   glucose blood (ACCU-CHEK GUIDE TEST) test strip [id number]  TEST BLOOD SUGAR DAILY E11.65 Barbara Schroeder HERO, FNP  Active   levocetirizine (XYZAL ) 5 MG tablet [id number]  Take 1 tablet (5 mg total) by mouth every evening. Barbara Schroeder HERO, FNP  Active   lisinopril -hydrochlorothiazide  (ZESTORETIC ) 20-12.5 MG tablet [id number]  Take 1 tablet by mouth daily. Barbara Schroeder HERO, FNP  Active   metFORMIN  (GLUCOPHAGE -XR) 500 MG 24 hr tablet [id number]  TAKE 2 TABLETS BY MOUTH 2 TIMES A DAY Rakes, Schroeder HERO, FNP  Active              Assessment/Plan:   Osteoporosis: - Currently inappropriately managed/opportunity for optimization - Reviewed recommendation for daily calcium  intake of 1200 mg and vitamin D intake of [phone] units - Recommended to choose calcium  citrate formulation due to concurrent acid reflux medication - Reviewed benefits of weight bearing exercise - Recommend to start alendronate 70mg  weekly (GFR >80)  Take only upon rising for the day; do not take at bedtime or before arising Take at least one-half hour before the first food, beverage, or medication of the day  Swallow tablet with 6 to 8 ounces plain water  Do not lie down for at least 30 minutes after administration and until after the first food of the day  Missed dose: If a once-weekly dose is missed, take one dose on the morning after the missed dose is remembered, and then return to taking one dose per week as originally scheduled on their chosen day; do not take 2 doses on the same day   Follow Up Plan: 1 month  Mliss Tarry Griffin, PharmD, BCACP, CPP Clinical Pharmacist, [facility]

## 2024-07-10 ENCOUNTER — Other Ambulatory Visit: Payer: Self-pay | Admitting: Family Medicine

## 2024-07-10 DIAGNOSIS — E1165 Type 2 diabetes mellitus with hyperglycemia: Secondary | ICD-10-CM

## 2024-08-06 ENCOUNTER — Other Ambulatory Visit: Payer: Self-pay | Admitting: *Deleted

## 2024-08-06 DIAGNOSIS — E1165 Type 2 diabetes mellitus with hyperglycemia: Secondary | ICD-10-CM

## 2024-08-06 MED ORDER — METFORMIN HCL ER 500 MG PO TB24: 1000.0000 mg | ORAL_TABLET | Freq: Two times a day (BID) | ORAL | 0 refills | Status: AC

## 2024-08-29 ENCOUNTER — Ambulatory Visit: Payer: Self-pay | Admitting: Family Medicine

## 2024-09-06 ENCOUNTER — Encounter: Payer: Self-pay | Admitting: Family Medicine

## 2024-09-06 ENCOUNTER — Ambulatory Visit (INDEPENDENT_AMBULATORY_CARE_PROVIDER_SITE_OTHER): Admitting: Family Medicine

## 2024-09-06 ENCOUNTER — Ambulatory Visit: Payer: Self-pay | Admitting: Family Medicine

## 2024-09-06 VITALS — BP 128/74 | HR 69 | Temp 97.2°F | Ht 61.0 in | Wt 126.0 lb

## 2024-09-06 DIAGNOSIS — R4189 Other symptoms and signs involving cognitive functions and awareness: Secondary | ICD-10-CM

## 2024-09-06 DIAGNOSIS — E1165 Type 2 diabetes mellitus with hyperglycemia: Secondary | ICD-10-CM | POA: Diagnosis not present

## 2024-09-06 DIAGNOSIS — E1169 Type 2 diabetes mellitus with other specified complication: Secondary | ICD-10-CM | POA: Diagnosis not present

## 2024-09-06 DIAGNOSIS — E785 Hyperlipidemia, unspecified: Secondary | ICD-10-CM | POA: Diagnosis not present

## 2024-09-06 DIAGNOSIS — E1159 Type 2 diabetes mellitus with other circulatory complications: Secondary | ICD-10-CM | POA: Diagnosis not present

## 2024-09-06 DIAGNOSIS — I152 Hypertension secondary to endocrine disorders: Secondary | ICD-10-CM

## 2024-09-06 DIAGNOSIS — Z7984 Long term (current) use of oral hypoglycemic drugs: Secondary | ICD-10-CM | POA: Diagnosis not present

## 2024-09-06 DIAGNOSIS — M810 Age-related osteoporosis without current pathological fracture: Secondary | ICD-10-CM

## 2024-09-06 LAB — BAYER DCA HB A1C WAIVED: HB A1C (BAYER DCA - WAIVED): 6.7 % — ABNORMAL HIGH (ref 4.8–5.6)

## 2024-09-06 MED ORDER — BLOOD PRESSURE CUFF MISC: 1.0000 | Freq: Two times a day (BID) | 0 refills | Status: AC

## 2024-09-06 NOTE — Progress Notes (Signed)
 "    Subjective:  Patient ID: Barbara Schroeder, female    DOB: [birth year], 73 y.o.   MRN: [id number]  Patient Care Team: Severa Rock HERO, FNP as PCP - General (Family Medicine) Billee Mliss BIRCH, RPH-CPP as Pharmacist (Family Medicine)   Chief Complaint:  Diabetes (3 month follow up )   HPI: Barbara Schroeder is a 73 y.o. female presenting on 09/06/2024 for Diabetes (3 month follow up )   Barbara Schroeder is a 73 year old female with diabetes, hypertension, and hyperlipidemia who presents for a follow-up visit.  She reports no changes in her condition since the last visit. Her blood sugar levels at home range from slightly below 100 to around 100. She does not monitor her blood pressure at home. Current medications include lisinopril , hydrochlorothiazide , atenolol , atorvastatin , fenofibrate , glipizide , metformin , Farxiga , and Fosamax , and she confirms adherence to her medication regimen.  No increased hunger, thirst, or urination. No headaches, leg swelling, body pain, or vision problems.          Relevant past medical, surgical, family, and social history reviewed and updated as indicated.  Allergies and medications reviewed and updated. Data reviewed: Chart in Epic.   Past Medical History:  Diagnosis Date   Cataract    Diabetes mellitus without complication (HCC)    Hypertension    Osteopenia 04/28/2021    Past Surgical History:  Procedure Laterality Date   EYE SURGERY      Social History   Socioeconomic History   Marital status: Married    Spouse name: Not on file   Number of children: Not on file   Years of education: Not on file   Highest education level: Not on file  Occupational History   Occupation: Disabled  Tobacco Use   Smoking status: Former    Current packs/day: 0.00    Types: Cigarettes    Quit date: 1995    Years since quitting: 30.9   Smokeless tobacco: Never  Vaping Use   Vaping status: Never Used  Substance and Sexual Activity   Alcohol use:  Not Currently   Drug use: Never   Sexual activity: Not on file  Other Topics Concern   Not on file  Social History Narrative   Not on file   Social Drivers of Health   Tobacco Use: Medium Risk (09/06/2024)   Patient History    Smoking Tobacco Use: Former    Smokeless Tobacco Use: Never    Passive Exposure: Not on Actuary Strain: Low Risk (07/01/2022)   Overall Financial Resource Strain (CARDIA)    Difficulty of Paying Living Expenses: Not hard at all  Food Insecurity: No Food Insecurity (07/01/2022)   Hunger Vital Sign    Worried About Running Out of Food in the Last Year: Never true    Ran Out of Food in the Last Year: Never true  Transportation Needs: Unmet Transportation Needs (07/01/2022)   PRAPARE - Administrator, Civil Service (Medical): Yes    Lack of Transportation (Non-Medical): Yes  Physical Activity: Inactive (07/01/2022)   Exercise Vital Sign    Days of Exercise per Week: 0 days    Minutes of Exercise per Session: 0 min  Stress: No Stress Concern Present (07/01/2022)   [employer] of Occupational Health - Occupational Stress Questionnaire    Feeling of Stress : Not at all  Social Connections: Moderately Isolated (07/01/2022)   Social Connection and Isolation Panel    Frequency of Communication with Friends and  Family: Twice a week    Frequency of Social Gatherings with Friends and Family: Once a week    Attends Religious Services: Never    Database Administrator or Organizations: No    Attends Banker Meetings: Never    Marital Status: Married  Catering Manager Violence: Not At Risk (07/01/2022)   Humiliation, Afraid, Rape, and Kick questionnaire    Fear of Current or Ex-Partner: No    Emotionally Abused: No    Physically Abused: No    Sexually Abused: No  Depression (PHQ2-9): Low Risk (09/06/2024)   Depression (PHQ2-9)    PHQ-2 Score: 0  Alcohol Screen: Low Risk (07/01/2022)   Alcohol Screen    Last  Alcohol Screening Score (AUDIT): 0  Housing: Low Risk (07/01/2022)   Housing    Last Housing Risk Score: 0  Utilities: Not At Risk (07/01/2022)   AHC Utilities    Threatened with loss of utilities: No  Health Literacy: Not on file    Outpatient Encounter Medications as of 09/06/2024  Medication Sig   alendronate  (FOSAMAX ) 70 MG tablet Take 1 tablet (70 mg total) by mouth every 7 (seven) days. Take with a full glass of water on an empty stomach.   atenolol  (TENORMIN ) 50 MG tablet Take 1 tablet (50 mg total) by mouth daily.   atorvastatin  (LIPITOR) 20 MG tablet Take 1 tablet (20 mg total) by mouth daily.   Blood Glucose Monitoring Suppl (ACCU-CHEK GUIDE ME) w/Device KIT Test BS daily Dx E11.65   Blood Pressure Monitoring (BLOOD PRESSURE CUFF) MISC 1 Device by Does not apply route in the morning and at bedtime.   Calcium  Carb-Cholecalciferol (CALCIUM -VITAMIN D3) 600-12.5 MG-MCG CAPS Take 1 tablet by mouth daily.   dapagliflozin  propanediol (FARXIGA ) 10 MG TABS tablet Take 1 tablet (10 mg total) by mouth daily before breakfast.   fenofibrate  (TRICOR ) 145 MG tablet Take 1 tablet (145 mg total) by mouth daily.   glipiZIDE  (GLUCOTROL ) 10 MG tablet Take 1 tablet (10 mg total) by mouth 2 (two) times daily.   glucose blood (ACCU-CHEK GUIDE TEST) test strip TEST BLOOD SUGAR DAILY E11.65   levocetirizine (XYZAL ) 5 MG tablet Take 1 tablet (5 mg total) by mouth every evening.   lisinopril -hydrochlorothiazide  (ZESTORETIC ) 20-12.5 MG tablet Take 1 tablet by mouth daily.   metFORMIN  (GLUCOPHAGE -XR) 500 MG 24 hr tablet Take 2 tablets (1,000 mg total) by mouth 2 (two) times daily with a meal.   No facility-administered encounter medications on file as of 09/06/2024.    Allergies[1]  Pertinent ROS per HPI, otherwise unremarkable      Objective:  BP 128/74   Pulse 69   Temp (!) 97.2 F (36.2 C)   Ht 5' 1 (1.549 m)   Wt 126 lb (57.2 kg)   SpO2 93%   BMI 23.81 kg/m    Wt Readings from Last 3  Encounters:  09/06/24 126 lb (57.2 kg)  05/30/24 124 lb 12.8 oz (56.6 kg)  02/27/24 124 lb 12.8 oz (56.6 kg)    Physical Exam Vitals and nursing note reviewed.  Constitutional:      Appearance: Normal appearance.  HENT:     Head: Normocephalic and atraumatic.     Mouth/Throat:     Mouth: Mucous membranes are moist.  Eyes:     Pupils: Pupils are equal, round, and reactive to light.  Cardiovascular:     Rate and Rhythm: Normal rate and regular rhythm.     Heart sounds: Normal heart sounds.  Pulmonary:     Effort: Pulmonary effort is normal.     Breath sounds: Normal breath sounds.  Musculoskeletal:     Cervical back: Neck supple.     Right lower leg: No edema.     Left lower leg: No edema.  Skin:    General: Skin is warm and dry.     Capillary Refill: Capillary refill takes less than 2 seconds.  Neurological:     Mental Status: She is alert. Mental status is at baseline.  Psychiatric:        Speech: She is noncommunicative (nonverbal).      Results for orders placed or performed in visit on 05/30/24  Bayer DCA Hb A1c Waived   Collection Time: 05/30/24 11:57 AM  Result Value Ref Range   HB A1C (BAYER DCA - WAIVED) 6.4 (H) 4.8 - 5.6 %  CBC with Differential/Platelet   Collection Time: 05/30/24 12:00 PM  Result Value Ref Range   WBC 5.5 3.4 - 10.8 x10E3/uL   RBC 5.00 3.77 - 5.28 x10E6/uL   Hemoglobin 14.6 11.1 - 15.9 g/dL   Hematocrit 55.6 65.9 - 46.6 %   MCV 89 79 - 97 fL   MCH 29.2 26.6 - 33.0 pg   MCHC 33.0 31.5 - 35.7 g/dL   RDW 86.7 88.2 - 84.5 %   Platelets 163 150 - 450 x10E3/uL   Neutrophils 52 Not Estab. %   Lymphs 32 Not Estab. %   Monocytes 7 Not Estab. %   Eos 8 Not Estab. %   Basos 1 Not Estab. %   Neutrophils Absolute 2.9 1.4 - 7.0 x10E3/uL   Lymphocytes Absolute 1.8 0.7 - 3.1 x10E3/uL   Monocytes Absolute 0.4 0.1 - 0.9 x10E3/uL   EOS (ABSOLUTE) 0.4 0.0 - 0.4 x10E3/uL   Basophils Absolute 0.1 0.0 - 0.2 x10E3/uL   Immature Granulocytes 0 Not  Estab. %   Immature Grans (Abs) 0.0 0.0 - 0.1 x10E3/uL  CMP14+EGFR   Collection Time: 05/30/24 12:00 PM  Result Value Ref Range   Glucose 168 (H) 70 - 99 mg/dL   BUN 13 8 - 27 mg/dL   Creatinine, Ser 9.25 0.57 - 1.00 mg/dL   eGFR 87 >40 fO/fpw/8.26   BUN/Creatinine Ratio 18 12 - 28   Sodium 140 134 - 144 mmol/L   Potassium 3.9 3.5 - 5.2 mmol/L   Chloride 106 96 - 106 mmol/L   CO2 18 (L) 20 - 29 mmol/L   Calcium  9.3 8.7 - 10.3 mg/dL   Total Protein 6.7 6.0 - 8.5 g/dL   Albumin 4.3 3.9 - 4.9 g/dL   Globulin, Total 2.4 1.5 - 4.5 g/dL   Bilirubin Total 0.4 0.0 - 1.2 mg/dL   Alkaline Phosphatase 65 44 - 121 IU/L   AST 15 0 - 40 IU/L   ALT 13 0 - 32 IU/L  Lipid panel   Collection Time: 05/30/24 12:00 PM  Result Value Ref Range   Cholesterol, Total 159 100 - 199 mg/dL   Triglycerides 668 (H) 0 - 149 mg/dL   HDL 33 (L) >60 mg/dL   VLDL Cholesterol Cal 53 (H) 5 - 40 mg/dL   LDL Chol Calc (NIH) 73 0 - 99 mg/dL   Chol/HDL Ratio 4.8 (H) 0.0 - 4.4 ratio  Vitamin B12   Collection Time: 05/30/24 12:00 PM  Result Value Ref Range   Vitamin B-12 457 232 - 1,245 pg/mL  Thyroid  Panel With TSH   Collection Time: 05/30/24 12:00 PM  Result  Value Ref Range   TSH 2.790 0.450 - 4.500 uIU/mL   T4, Total 8.1 4.5 - 12.0 ug/dL   T3 Uptake Ratio 29 24 - 39 %   Free Thyroxine Index 2.3 1.2 - 4.9       Pertinent labs & imaging results that were available during my care of the patient were reviewed by me and considered in my medical decision making.  Assessment & Plan:  Barbara Schroeder was seen today for diabetes.  Diagnoses and all orders for this visit:  Type 2 diabetes mellitus with hyperglycemia, without long-term current use of insulin (HCC) -     Bayer DCA Hb A1c Waived -     CMP14+EGFR -     Lipid panel -     Blood Pressure Monitoring (BLOOD PRESSURE CUFF) MISC; 1 Device by Does not apply route in the morning and at bedtime.  Hyperlipidemia associated with type 2 diabetes mellitus (HCC) -      Bayer DCA Hb A1c Waived -     CMP14+EGFR -     Lipid panel -     Blood Pressure Monitoring (BLOOD PRESSURE CUFF) MISC; 1 Device by Does not apply route in the morning and at bedtime.  Hypertension associated with diabetes (HCC) -     Bayer DCA Hb A1c Waived -     CMP14+EGFR -     Lipid panel -     Blood Pressure Monitoring (BLOOD PRESSURE CUFF) MISC; 1 Device by Does not apply route in the morning and at bedtime.  Age-related osteoporosis without current pathological fracture -     CMP14+EGFR  Cognitive impairment -     CMP14+EGFR        Type 2 diabetes mellitus Well-controlled with an A1c of 6.7%. Blood glucose levels at home range from slightly below 100 to 100, indicating good glycemic control. - Continue current diabetes medications: glipizide , metformin , and Farxiga . - Ordered A1c test and will review results when available.  Age-related osteoporosis Managed with Fosamax  (alendronate ) once a week. - Continue Fosamax  once a week.          Continue all other maintenance medications.  Follow up plan: Return in about 3 months (around 12/05/2024) for DM.   Continue healthy lifestyle choices, including diet (rich in fruits, vegetables, and lean proteins, and low in salt and simple carbohydrates) and exercise (at least 30 minutes of moderate physical activity daily).  Educational handout given for DM  The above assessment and management plan was discussed with the patient. The patient verbalized understanding of and has agreed to the management plan. Patient is aware to call the clinic if they develop any new symptoms or if symptoms persist or worsen. Patient is aware when to return to the clinic for a follow-up visit. Patient educated on when it is appropriate to go to the emergency department.   Barbara Bruns, FNP-C [facility] [phone]     [1] No Known Allergies  "

## 2024-09-06 NOTE — Patient Instructions (Addendum)

## 2024-09-07 LAB — LIPID PANEL
Chol/HDL Ratio: 3.1 ratio (ref 0.0–4.4)
Cholesterol, Total: 105 mg/dL (ref 100–199)
HDL: 34 mg/dL — ABNORMAL LOW
LDL Chol Calc (NIH): 40 mg/dL (ref 0–99)
Triglycerides: 192 mg/dL — ABNORMAL HIGH (ref 0–149)
VLDL Cholesterol Cal: 31 mg/dL (ref 5–40)

## 2024-09-07 LAB — CMP14+EGFR
ALT: 16 IU/L (ref 0–32)
AST: 16 IU/L (ref 0–40)
Albumin: 4.2 g/dL (ref 3.9–4.9)
Alkaline Phosphatase: 33 IU/L — ABNORMAL LOW (ref 49–135)
BUN/Creatinine Ratio: 19 (ref 12–28)
BUN: 16 mg/dL (ref 8–27)
Bilirubin Total: 0.4 mg/dL (ref 0.0–1.2)
CO2: 23 mmol/L (ref 20–29)
Calcium: 9.5 mg/dL (ref 8.7–10.3)
Chloride: 105 mmol/L (ref 96–106)
Creatinine, Ser: 0.84 mg/dL (ref 0.57–1.00)
Globulin, Total: 1.9 g/dL (ref 1.5–4.5)
Glucose: 200 mg/dL — ABNORMAL HIGH (ref 70–99)
Potassium: 4.4 mmol/L (ref 3.5–5.2)
Sodium: 141 mmol/L (ref 134–144)
Total Protein: 6.1 g/dL (ref 6.0–8.5)
eGFR: 75 mL/min/1.73

## 2024-12-06 ENCOUNTER — Ambulatory Visit: Admitting: Family Medicine
